# Patient Record
Sex: Male | Born: 1980 | Race: Black or African American | Hispanic: No | Marital: Single | Smoking: Current some day smoker
Health system: Southern US, Community
[De-identification: ages and names within clinical notes are randomized; demographics above are authoritative.]

## PROBLEM LIST (undated history)

## (undated) DIAGNOSIS — T7840XA Allergy, unspecified, initial encounter: Secondary | ICD-10-CM

## (undated) DIAGNOSIS — F819 Developmental disorder of scholastic skills, unspecified: Secondary | ICD-10-CM

## (undated) DIAGNOSIS — J45909 Unspecified asthma, uncomplicated: Secondary | ICD-10-CM

## (undated) HISTORY — DX: Developmental disorder of scholastic skills, unspecified: F81.9

## (undated) HISTORY — DX: Unspecified asthma, uncomplicated: J45.909

## (undated) HISTORY — DX: Allergy, unspecified, initial encounter: T78.40XA

---
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
## 2017-04-17 ENCOUNTER — Encounter: Payer: Self-pay | Admitting: Physician Assistant

## 2018-05-03 DIAGNOSIS — R6889 Other general symptoms and signs: Secondary | ICD-10-CM | POA: Diagnosis not present

## 2018-05-03 DIAGNOSIS — F329 Major depressive disorder, single episode, unspecified: Secondary | ICD-10-CM | POA: Diagnosis not present

## 2018-05-08 ENCOUNTER — Ambulatory Visit (INDEPENDENT_AMBULATORY_CARE_PROVIDER_SITE_OTHER): Payer: Medicare HMO | Admitting: Physician Assistant

## 2018-05-08 ENCOUNTER — Ambulatory Visit (INDEPENDENT_AMBULATORY_CARE_PROVIDER_SITE_OTHER): Payer: Medicare HMO

## 2018-05-08 ENCOUNTER — Encounter: Payer: Self-pay | Admitting: Physician Assistant

## 2018-05-08 ENCOUNTER — Other Ambulatory Visit: Payer: Self-pay

## 2018-05-08 VITALS — BP 128/79 | HR 85 | Temp 97.5°F | Resp 18 | Ht 71.65 in | Wt 214.6 lb

## 2018-05-08 DIAGNOSIS — J4530 Mild persistent asthma, uncomplicated: Secondary | ICD-10-CM

## 2018-05-08 DIAGNOSIS — J302 Other seasonal allergic rhinitis: Secondary | ICD-10-CM | POA: Diagnosis not present

## 2018-05-08 DIAGNOSIS — H5203 Hypermetropia, bilateral: Secondary | ICD-10-CM

## 2018-05-08 DIAGNOSIS — F329 Major depressive disorder, single episode, unspecified: Secondary | ICD-10-CM | POA: Diagnosis not present

## 2018-05-08 DIAGNOSIS — R6889 Other general symptoms and signs: Secondary | ICD-10-CM | POA: Diagnosis not present

## 2018-05-08 DIAGNOSIS — F819 Developmental disorder of scholastic skills, unspecified: Secondary | ICD-10-CM | POA: Diagnosis not present

## 2018-05-08 DIAGNOSIS — Z7689 Persons encountering health services in other specified circumstances: Secondary | ICD-10-CM

## 2018-05-08 DIAGNOSIS — F32A Depression, unspecified: Secondary | ICD-10-CM

## 2018-05-08 DIAGNOSIS — M25512 Pain in left shoulder: Secondary | ICD-10-CM

## 2018-05-08 DIAGNOSIS — Z23 Encounter for immunization: Secondary | ICD-10-CM

## 2018-05-08 DIAGNOSIS — M25511 Pain in right shoulder: Secondary | ICD-10-CM | POA: Diagnosis not present

## 2018-05-08 MED ORDER — ALBUTEROL SULFATE HFA 108 (90 BASE) MCG/ACT IN AERS: 2.0000 | INHALATION_SPRAY | RESPIRATORY_TRACT | 0 refills | Status: DC | PRN

## 2018-05-08 MED ORDER — FLOVENT HFA 110 MCG/ACT IN AERO: 2.0000 | INHALATION_SPRAY | Freq: Two times a day (BID) | RESPIRATORY_TRACT | 0 refills | Status: DC

## 2018-05-08 NOTE — Patient Instructions (Addendum)
For shoulder pain, we have obtain an x-ray today and will have those results back within the next few days.  In the meantime, I suggest using a heating pad to the affected area.  You may also use over-the-counter ibuprofen.  Avoid heavy lifting.  Depending on results, we may send referral to orthopedics.   For asthma, I suggest using Flovent twice daily as opposed to once daily to help control your allergies better.  Use albuterol only as needed.  Please follow-up in 6 weeks for reevaluation.  We can do a complete physical exam with lab work at that visit.  For eye issues, I have placed referral to ophthalmology.  They should contact you within the next 2 weeks.    If you have lab work done today you will be contacted with your lab results within the next 2 weeks.  If you have not heard from [place] then please contact us. The fastest way to get your results is to register for My Chart.  Shoulder Sprain A shoulder sprain is a partial or complete tear in one of the tough, fiber-like tissues (ligaments) in the shoulder. The ligaments in the shoulder help to hold the shoulder in place. What are the causes? This condition may be caused by:  A fall.  A hit to the shoulder.  A twist of the arm.  What increases the risk? This condition is more likely to develop in:  People who play sports.  People who have problems with balance or coordination.  What are the signs or symptoms? Symptoms of this condition include:  Pain when moving the shoulder.  Limited ability to move the shoulder.  Swelling and tenderness on top of the shoulder.  Warmth in the shoulder.  A change in the shape of the shoulder.  Redness or bruising on the shoulder.  How is this diagnosed? This condition is diagnosed with a physical exam. During the exam, you may be asked to do simple exercises with your shoulder. You may also have imaging tests, such as X-rays, MRI, or a CT scan. These tests can show how severe the  sprain is. How is this treated? This condition may be treated with:  Rest.  Pain medicine.  Ice.  A sling or brace. This is used to keep the arm still while the shoulder is healing.  Physical therapy or rehabilitation exercises. These help to improve the range of motion and strength of the shoulder.  Surgery (rare). Surgery may be needed if the sprain caused a joint to become unstable. Surgery may also be needed to reduce pain.  Some people may develop ongoing shoulder pain or lose some range of motion in the shoulder. However, most people do not develop long-term problems. Follow these instructions at home:  Rest.  Ask your health care provider when it is safe for you to drive if you have a sling or brace on your shoulder.  Take over-the-counter and prescription medicines only as told by your health care provider.  If directed, apply ice to the area: ? Put ice in a plastic bag. ? Place a towel between your skin and the bag. ? Leave the ice on for 20 minutes, 2-3 times per day.  If you were given a shoulder sling or brace: ? Wear it as told. ? Remove it to shower or bathe. ? Move your arm only as much as told by your health care provider, but keep your hand moving to prevent swelling.  If you were shown how  to do any exercises, do them as told by your health care provider.  Keep all follow-up visits as told by your health care provider. This is important. Contact a health care provider if:  Your pain gets worse.  Your pain is not relieved with medicines.  You have increased redness or swelling. Get help right away if:  You have a fever.  You cannot move your arm or shoulder.  You develop severe numbness or tingling in your arm, hand, or fingers.  Your arm, hand, or fingers turn blue, white, or gray and feel cold. This information is not intended to replace advice given to you by your health care provider. Make sure you discuss any questions you have with your  health care provider. Document Released: 11/26/2008 Document Revised: 03/05/2016 Document Reviewed: 11/02/2014 Elsevier Interactive Patient Education  2018 [employer].   IF you received an x-ray today, you will receive an invoice from [facility]. Please contact [facility] at [phone] with questions or concerns regarding your invoice.   IF you received labwork today, you will receive an invoice from [place]. Please contact [facility] at [phone] with questions or concerns regarding your invoice.   Our billing staff will not be able to assist you with questions regarding bills from these companies.  You will be contacted with the lab results as soon as they are available. The fastest way to get your results is to activate your My Chart account. Instructions are located on the last page of this paperwork. If you have not heard from [place] regarding the results in 2 weeks, please contact this office.

## 2018-05-08 NOTE — Progress Notes (Signed)
Frank Fox  MRN: [id number] DOB: 25-Feb-1981  Subjective:  Pt is a 37 y.o. male who presents to establish care, med refills, and shoulder pain. Last CPE 04/17/17.  Chronic conditions:  -Persistent asthma: Takes flovent inhaler daily and albuterol inhaler as needed. Feels well controlled on this medication. Has been having to use albuterol inhaler more lately due to being outside mowing grass this season. Sometimes get SOB, chest tightness, and wheezing. Needs med refills.   -Depression: Takes zoloft 100mg  daily. Seeing a therapist here. Sees them monthly. They provide him with refills of zoloft.   -Learning disability  -Seasonal allergies: Takes zyrtec and eye drops daily.   Acute issues:  -Left shoulder pain x 1 months. Happened after helping a friend move furniture. Feels it pop occasionally. Had someone pull it back in place a few days ago. Aggravated by lifting. Relieved with rest. Has not tried anything for pain. Denies numbness, tingling, redness, and warmth. PMH of shoulder dislocation ~15 years ago after football injury.    -Needs glasses and is looking for an eye doctor. Was seeing eye doctor in [place], dx with hypermetropia of b/l eyes but did not go back to them before moving.    There are no active problems to display for this patient.   Current Outpatient Medications on File Prior to Visit  Medication Sig Dispense Refill  . cetirizine (ZYRTEC) 10 MG tablet Take 10 mg by mouth daily.    Marland Kitchen ibuprofen (ADVIL,MOTRIN) 600 MG tablet Take 600 mg by mouth every 6 (six) hours as needed.    . sertraline (ZOLOFT) 100 MG tablet     . Olopatadine HCl 0.2 % SOLN      No current facility-administered medications on file prior to visit.     No Known Allergies  Social History   Socioeconomic History  . Marital status: Single    Spouse name: Not on file  . Number of children: 0  . Years of education: Not on file  . Highest education level: Not on file  Occupational History    . Occupation: unemployed  Social Needs  . Financial resource strain: Not on file  . Food insecurity:    Worry: Not on file    Inability: Not on file  . Transportation needs:    Medical: Not on file    Non-medical: Not on file  Tobacco Use  . Smoking status: Current Some Day Smoker    Types: Cigars  . Smokeless tobacco: Never Used  . Tobacco comment: occassionally smokes 1-2 black and milds a week  Substance and Sexual Activity  . Alcohol use: Yes    Alcohol/week: 2.0 standard drinks    Types: 2 Cans of beer per week  . Drug use: Not on file  . Sexual activity: Not Currently    Partners: Female  Lifestyle  . Physical activity:    Days per week: 0 days    Minutes per session: 0 min  . Stress: Not on file  Relationships  . Social connections:    Talks on phone: Not on file    Gets together: Not on file    Attends religious service: Not on file    Active member of club or organization: Not on file    Attends meetings of clubs or organizations: Not on file    Relationship status: Not on file  Other Topics Concern  . Not on file  Social History Narrative   Just moved from [place] to [place]. Living with brother.  Currently unemployed. He has a girlfriend. No children.     No family history on file.  Review of Systems  Constitutional: Negative for chills, diaphoresis, fatigue and fever.  Respiratory: Negative for cough and shortness of breath.   Cardiovascular: Negative for chest pain, palpitations and leg swelling.  Gastrointestinal: Negative for nausea and vomiting.  Neurological: Negative for dizziness and headaches.    Objective:  BP 128/79   Pulse 85   Temp (!) 97.5 F (36.4 C) (Oral)   Resp 18   Ht 5' 11.65" (1.82 m)   Wt 214 lb 9.6 oz (97.3 kg)   SpO2 95%   BMI 29.39 kg/m   Physical Exam  Constitutional: He is oriented to person, place, and time. He appears well-developed and well-nourished. No distress.  HENT:  Head: Normocephalic and atraumatic.   Eyes: Conjunctivae are normal.  Neck: Normal range of motion.  Cardiovascular: Normal rate, regular rhythm, normal heart sounds and intact distal pulses.  Pulses:      Radial pulses are 2+ on the right side, and 2+ on the left side.  Pulmonary/Chest: Effort normal and breath sounds normal. No stridor. No respiratory distress. He has no wheezes. He has no rhonchi. He has no rales. He exhibits no tenderness.  Musculoskeletal:       Left shoulder: He exhibits normal range of motion, no bony tenderness, no swelling, no effusion, normal pulse and normal strength.       Cervical back: He exhibits normal range of motion and no bony tenderness.       Back:  Neurological: He is alert and oriented to person, place, and time.  Reflex Scores:      Tricep reflexes are 2+ on the right side and 2+ on the left side.      Bicep reflexes are 2+ on the right side and 2+ on the left side. Sensation of BUE intact.   Skin: Skin is warm and dry.  Psychiatric: He has a normal mood and affect.  Vitals reviewed.   Visual Acuity Screening   Right eye Left eye Both eyes  Without correction: 20/20 20/20 20/20   With correction:      Dg Shoulder Left  Result Date: 05/08/2018 CLINICAL DATA:  Shoulder pain for 1 month EXAM: LEFT SHOULDER - 2+ VIEW COMPARISON:  None. FINDINGS: There is no evidence of fracture or dislocation. There is no evidence of arthropathy or other focal bone abnormality. Soft tissues are unremarkable. IMPRESSION: No acute osseous injury of the left shoulder. Electronically Signed   By: Elige Ko   On: 05/08/2018 15:33    ACT score of 17.   Assessment and Plan :  1. Left shoulder pain, unspecified chronicity No acute findings noted on plain films.  No acute neuro findings.  Suggest rest, heating pad, over-the-counter NSAIDs, and Rx muscle relaxant as needed for pain. F/u as needed. - DG Shoulder Left; Future - baclofen (LIORESAL) 10 MG tablet; Take 0.5-1 tablets (5-10 mg total) by mouth  3 (three) times daily.  Dispense: 30 each; Refill: 0  2. Hyperopia of both eyes Referral provided. - Ambulatory referral to Ophthalmology  3. Mild persistent asthma without complication ACT score of 17.  Suggesting not fully controlled.  Lungs CTA B.  Suggest increasing Flovent to twice daily as opposed to once daily.  Continue using albuterol as needed.  Avoid irritants.  Follow-up in 6 weeks for reevaluation.- FLOVENT HFA 110 MCG/ACT inhaler; Inhale 2 puffs into the lungs 2 (two) times daily.  Dispense: 3 Inhaler; Refill: 0 - albuterol (PROVENTIL HFA;VENTOLIN HFA) 108 (90 Base) MCG/ACT inhaler; Inhale 2 puffs into the lungs every 4 (four) hours as needed for wheezing or shortness of breath.  Dispense: 1 Inhaler; Refill: 0  4. Depression, unspecified depression type Continue Zoloft and following up with psychiatry as planned.   5. Learning disability 6. Seasonal allergies Refills provided. - cetirizine (ZYRTEC) 10 MG tablet; Take 10 mg by mouth daily. - Olopatadine HCl 0.2 % SOLN  7. Flu vaccine need - Flu Vaccine QUAD 36+ mos IM  8. Encounter to establish care I am happy to take over patient's care.  Recommend following up the next few weeks for complete physical exam and fasting labs.  A total of 45 minintes was spent in the room with the patient, greater than 50% of which was in counseling/coordination of care regarding chronic medical conditions and acute issues as above.  Benjiman Core, PA-C  Primary Care at [facility] 05/10/2018 12:26 PM

## 2018-05-09 DIAGNOSIS — F439 Reaction to severe stress, unspecified: Secondary | ICD-10-CM | POA: Diagnosis not present

## 2018-05-09 DIAGNOSIS — R6889 Other general symptoms and signs: Secondary | ICD-10-CM | POA: Diagnosis not present

## 2018-05-09 MED ORDER — BACLOFEN 10 MG PO TABS: 5.0000 mg | ORAL_TABLET | Freq: Three times a day (TID) | ORAL | 0 refills | Status: DC

## 2018-05-10 DIAGNOSIS — J302 Other seasonal allergic rhinitis: Secondary | ICD-10-CM | POA: Insufficient documentation

## 2018-05-10 DIAGNOSIS — H5203 Hypermetropia, bilateral: Secondary | ICD-10-CM | POA: Insufficient documentation

## 2018-05-10 DIAGNOSIS — F32A Depression, unspecified: Secondary | ICD-10-CM | POA: Insufficient documentation

## 2018-05-10 DIAGNOSIS — J4530 Mild persistent asthma, uncomplicated: Secondary | ICD-10-CM | POA: Insufficient documentation

## 2018-05-10 DIAGNOSIS — F329 Major depressive disorder, single episode, unspecified: Secondary | ICD-10-CM | POA: Insufficient documentation

## 2018-05-10 DIAGNOSIS — F819 Developmental disorder of scholastic skills, unspecified: Secondary | ICD-10-CM | POA: Insufficient documentation

## 2018-05-30 DIAGNOSIS — F329 Major depressive disorder, single episode, unspecified: Secondary | ICD-10-CM | POA: Diagnosis not present

## 2018-05-30 DIAGNOSIS — R6889 Other general symptoms and signs: Secondary | ICD-10-CM | POA: Diagnosis not present

## 2018-06-03 DIAGNOSIS — F329 Major depressive disorder, single episode, unspecified: Secondary | ICD-10-CM | POA: Diagnosis not present

## 2018-06-04 ENCOUNTER — Other Ambulatory Visit: Payer: Self-pay | Admitting: Physician Assistant

## 2018-06-04 DIAGNOSIS — M25512 Pain in left shoulder: Secondary | ICD-10-CM

## 2018-06-04 NOTE — Telephone Encounter (Signed)
Copied from CRM [id number]. Topic: Quick Communication - Rx Refill/Question >> Jun 04, 2018 10:30 AM Marylen PontoMcneil, Ja-Kwan wrote: Medication: baclofen (LIORESAL) 10 MG tablet  Has the patient contacted their pharmacy? no  Preferred Pharmacy (with phone number or street name): [id number] Ginette Otto- [place], [place] - [address] AT [facility] [address] [phone] (Phone) [phone] (Fax)  Agent: Please be advised that RX refills may take up to 3 business days. We ask that you follow-up with your pharmacy.

## 2018-06-04 NOTE — Telephone Encounter (Signed)
Requested medication (s) are due for refill today: yes  Requested medication (s) are on the active medication list: yes    Last refill: 05/09/18  #30  0 refills  Future visit scheduled   11/13/2018  Notes to clinic: not delegated  Requested Prescriptions  Pending Prescriptions Disp Refills   baclofen (LIORESAL) 10 MG tablet 30 each 0    Sig: Take 0.5-1 tablets (5-10 mg total) by mouth 3 (three) times daily.     Not Delegated - Analgesics:  Muscle Relaxants Failed - 06/04/2018 10:36 AM      Failed - This refill cannot be delegated      Passed - Valid encounter within last 6 months    Recent Outpatient Visits          3 weeks ago Left shoulder pain, unspecified chronicity   [facility], Gerald StabsBrittany D, PA-C      Future Appointments            In 5 months Doristine BosworthStallings, Zoe A, MD [facility] at [place], [facility]

## 2018-06-06 ENCOUNTER — Other Ambulatory Visit: Payer: Self-pay | Admitting: Physician Assistant

## 2018-06-06 DIAGNOSIS — J4530 Mild persistent asthma, uncomplicated: Secondary | ICD-10-CM

## 2018-06-06 DIAGNOSIS — M25512 Pain in left shoulder: Secondary | ICD-10-CM

## 2018-06-06 DIAGNOSIS — J302 Other seasonal allergic rhinitis: Secondary | ICD-10-CM

## 2018-06-06 DIAGNOSIS — F329 Major depressive disorder, single episode, unspecified: Secondary | ICD-10-CM | POA: Diagnosis not present

## 2018-06-06 MED ORDER — ALBUTEROL SULFATE HFA 108 (90 BASE) MCG/ACT IN AERS: 2.0000 | INHALATION_SPRAY | RESPIRATORY_TRACT | 2 refills | Status: AC | PRN

## 2018-06-06 MED ORDER — FLOVENT HFA 110 MCG/ACT IN AERO: 2.0000 | INHALATION_SPRAY | Freq: Two times a day (BID) | RESPIRATORY_TRACT | 1 refills | Status: AC

## 2018-06-06 NOTE — Telephone Encounter (Signed)
Requested medication (s) are due for refill today: yes  Requested medication (s) are on the active medication list: yes  Last refill:  Last refill of Baclofen 05/09/18 #30  Future visit scheduled: yes  Notes to clinic:  LOV on 05/08/18; Last refills of Zoloft and Olopatadine HCL 0.2% soln filled by historical provider    Requested Prescriptions  Pending Prescriptions Disp Refills   baclofen (LIORESAL) 10 MG tablet 30 each 0    Sig: Take 0.5-1 tablets (5-10 mg total) by mouth 3 (three) times daily.     Not Delegated - Analgesics:  Muscle Relaxants Failed - 06/06/2018  5:49 PM      Failed - This refill cannot be delegated      Passed - Valid encounter within last 6 months    Recent Outpatient Visits          4 weeks ago Left shoulder pain, unspecified chronicity   Primary Care at [facility], Gerald Stabs, PA-C      Future Appointments            In 5 months Doristine Bosworth, MD Primary Care at [place], [facility]          sertraline (ZOLOFT) 100 MG tablet       Psychiatry:  Antidepressants - SSRI Passed - 06/06/2018  5:49 PM      Passed - Completed PHQ-2 or PHQ-9 in the last 360 days.      Passed - Valid encounter within last 6 months    Recent Outpatient Visits          4 weeks ago Left shoulder pain, unspecified chronicity   Primary Care at [facility], Gerald Stabs, PA-C      Future Appointments            In 5 months Doristine Bosworth, MD Primary Care at [place], [facility]          Olopatadine HCl 0.2 % SOLN       Ophthalmology:  Antiallergy Passed - 06/06/2018  5:49 PM      Passed - Valid encounter within last 12 months    Recent Outpatient Visits          4 weeks ago Left shoulder pain, unspecified chronicity   Primary Care at [facility], Gerald Stabs, PA-C      Future Appointments            In 5 months Doristine Bosworth, MD Primary Care at [place], [facility]         Signed Prescriptions Disp Refills   FLOVENT HFA 110 MCG/ACT inhaler 3 Inhaler 1    Sig:  Inhale 2 puffs into the lungs 2 (two) times daily.     Pulmonology:  Corticosteroids Passed - 06/06/2018  5:49 PM      Passed - Valid encounter within last 12 months    Recent Outpatient Visits          4 weeks ago Left shoulder pain, unspecified chronicity   Primary Care at [facility], [place] D, PA-C      Future Appointments            In 5 months Doristine Bosworth, MD Primary Care at [place], [facility]          albuterol (PROVENTIL HFA;VENTOLIN HFA) 108 (90 Base) MCG/ACT inhaler 1 Inhaler 2    Sig: Inhale 2 puffs into the lungs every 4 (four) hours as needed for wheezing or shortness of breath.     Pulmonology:  Beta Agonists Failed - 06/06/2018  5:49 PM      Failed - One inhaler should last at least one month. If the patient is requesting refills earlier, contact the patient to check for uncontrolled symptoms.      Passed - Valid encounter within last 12 months    Recent Outpatient Visits          4 weeks ago Left shoulder pain, unspecified chronicity   [facility], Gerald StabsBrittany D, PA-C      Future Appointments            In 5 months Doristine BosworthStallings, Zoe A, MD [facility] at [place], [facility]

## 2018-06-06 NOTE — Telephone Encounter (Signed)
Copied from CRM [id number]. Topic: Quick Communication - Rx Refill/Question >> Jun 06, 2018  4:32 PM Maia PettiesOrtiz, Kristie S wrote: Medication: sertraline 100mg , Olopatadine HCl 0.2 % SOLN, FLOVENT HFA 110 MCG/ACT inhaler, baclofen (LIORESAL) 10 MG tablet, albuterol (PROVENTIL HFA;VENTOLIN HFA) 108 (90 Base) MCG/ACT inhaler  Pt has contacted mail order for ongoing refills for these 5 medications  Has the patient contacted their pharmacy? yes Preferred Pharmacy (with phone number or street name): [facility] [place], [place] - [id number] [phone] (Phone) [phone] (Fax)

## 2018-06-07 ENCOUNTER — Other Ambulatory Visit: Payer: Self-pay | Admitting: Physician Assistant

## 2018-06-07 DIAGNOSIS — M25512 Pain in left shoulder: Secondary | ICD-10-CM

## 2018-06-07 NOTE — Telephone Encounter (Signed)
Pt following up on refill request baclofen (LIORESAL) 10 MG tablet  Pt hopes to get a 90 day Rx, since GrenadaBrittany is leaving.  Pt states this med is really helping and he would like to stay on it. This was first requested 06/04/18.  [facility][id number] Ginette Otto- [place], [place] - [address] AT [facility] [address] [phone] (Phone) [phone] (Fax)

## 2018-06-11 MED ORDER — BACLOFEN 10 MG PO TABS: 5.0000 mg | ORAL_TABLET | Freq: Three times a day (TID) | ORAL | 0 refills | Status: DC

## 2018-06-11 NOTE — Telephone Encounter (Signed)
Spoke with pt regarding refill of baclofen. Advised that Barnett AbuWiseman needs pt to see another provider before refills will be filled. I was able to make pt an appt with Dr. Creta LevinStallings on 12/16 at 11:40. I advise dpt of time, building and late policy. Pt acknowledged.

## 2018-06-12 DIAGNOSIS — R6889 Other general symptoms and signs: Secondary | ICD-10-CM | POA: Diagnosis not present

## 2018-06-12 DIAGNOSIS — H5200 Hypermetropia, unspecified eye: Secondary | ICD-10-CM | POA: Diagnosis not present

## 2018-06-12 DIAGNOSIS — H40013 Open angle with borderline findings, low risk, bilateral: Secondary | ICD-10-CM | POA: Diagnosis not present

## 2018-06-19 ENCOUNTER — Telehealth: Payer: Self-pay | Admitting: Family Medicine

## 2018-06-19 NOTE — Telephone Encounter (Signed)
Called and spoke with pt regarding their appt on 07/08/18. Due to provider schedule changes, we needed to get pt rescheduled. I was able to reschedule with Dr. Creta LevinStallings on 07/10/18 at 10:40. I advised of time, building and late policy. Pt acknowledged.

## 2018-06-27 DIAGNOSIS — F329 Major depressive disorder, single episode, unspecified: Secondary | ICD-10-CM | POA: Diagnosis not present

## 2018-07-03 ENCOUNTER — Other Ambulatory Visit: Payer: Self-pay | Admitting: Physician Assistant

## 2018-07-03 DIAGNOSIS — J302 Other seasonal allergic rhinitis: Secondary | ICD-10-CM

## 2018-07-03 NOTE — Telephone Encounter (Signed)
Copied from CRM [id number]. Topic: Quick Communication - Rx Refill/Question >> Jul 03, 2018 11:30 AM [name], Frank E wrote: Medication: cetirizine (ZYRTEC) 10 MG tablet   Has the patient contacted their pharmacy? No. (out of medication)  Preferred Pharmacy (with phone number or street name): [facility] - [place], [place] - [id number] [phone] (Phone) [phone])    Agent: Please be advised that RX refills may take up to 3 business days. We ask that you follow-up with your pharmacy.

## 2018-07-04 DIAGNOSIS — F329 Major depressive disorder, single episode, unspecified: Secondary | ICD-10-CM | POA: Diagnosis not present

## 2018-07-04 DIAGNOSIS — R6889 Other general symptoms and signs: Secondary | ICD-10-CM | POA: Diagnosis not present

## 2018-07-04 NOTE — Telephone Encounter (Signed)
Requested medication (s) are due for refill today: yes  Requested medication (s) are on the active medication list: yes  Last refill:  Last filled by historical provider  Future visit scheduled: yes, 07/10/18 with Dr. Creta LevinStallings  Notes to clinic:  Unable to refill per protocol. Medication previously filled by historical provider    Requested Prescriptions  Pending Prescriptions Disp Refills   cetirizine (ZYRTEC) 10 MG tablet      Sig: Take 1 tablet (10 mg total) by mouth daily.     Ear, Nose, and Throat:  Antihistamines Passed - 07/03/2018  1:07 PM      Passed - Valid encounter within last 12 months    Recent Outpatient Visits          1 month ago Left shoulder pain, unspecified chronicity   [facility], Gerald StabsBrittany D, PA-C      Future Appointments            In 6 days Doristine BosworthStallings, Zoe A, MD [facility] [place], [facility]   In 4 months Doristine BosworthStallings, Zoe A, MD [facility] [place], [facility]

## 2018-07-06 MED ORDER — CETIRIZINE HCL 10 MG PO TABS: 10.0000 mg | ORAL_TABLET | Freq: Every day | ORAL | 0 refills | Status: DC

## 2018-07-08 ENCOUNTER — Ambulatory Visit: Payer: Medicare HMO | Admitting: Family Medicine

## 2018-07-09 DIAGNOSIS — R6889 Other general symptoms and signs: Secondary | ICD-10-CM | POA: Diagnosis not present

## 2018-07-10 ENCOUNTER — Ambulatory Visit (INDEPENDENT_AMBULATORY_CARE_PROVIDER_SITE_OTHER): Payer: Medicare HMO | Admitting: Family Medicine

## 2018-07-10 ENCOUNTER — Other Ambulatory Visit: Payer: Self-pay

## 2018-07-10 ENCOUNTER — Encounter: Payer: Self-pay | Admitting: Family Medicine

## 2018-07-10 VITALS — BP 129/87 | HR 84 | Temp 98.7°F | Resp 17 | Ht 71.65 in | Wt 214.2 lb

## 2018-07-10 DIAGNOSIS — J302 Other seasonal allergic rhinitis: Secondary | ICD-10-CM

## 2018-07-10 DIAGNOSIS — R6889 Other general symptoms and signs: Secondary | ICD-10-CM | POA: Diagnosis not present

## 2018-07-10 DIAGNOSIS — J4530 Mild persistent asthma, uncomplicated: Secondary | ICD-10-CM

## 2018-07-10 DIAGNOSIS — M62838 Other muscle spasm: Secondary | ICD-10-CM

## 2018-07-10 DIAGNOSIS — M25512 Pain in left shoulder: Secondary | ICD-10-CM

## 2018-07-10 DIAGNOSIS — L219 Seborrheic dermatitis, unspecified: Secondary | ICD-10-CM | POA: Diagnosis not present

## 2018-07-10 MED ORDER — CETIRIZINE HCL 10 MG PO TABS: 10.0000 mg | ORAL_TABLET | Freq: Every day | ORAL | 1 refills | Status: DC

## 2018-07-10 MED ORDER — BACLOFEN 10 MG PO TABS: 5.0000 mg | ORAL_TABLET | Freq: Three times a day (TID) | ORAL | 3 refills | Status: DC

## 2018-07-10 MED ORDER — KETOCONAZOLE 2 % EX SHAM: 1.0000 "application " | MEDICATED_SHAMPOO | CUTANEOUS | 2 refills | Status: DC

## 2018-07-10 MED ORDER — SERTRALINE HCL 100 MG PO TABS: 100.0000 mg | ORAL_TABLET | Freq: Every day | ORAL | 3 refills | Status: DC

## 2018-07-10 MED ORDER — TRIAMCINOLONE ACETONIDE 0.5 % EX OINT: 1.0000 "application " | TOPICAL_OINTMENT | Freq: Two times a day (BID) | CUTANEOUS | 5 refills | Status: DC

## 2018-07-10 NOTE — Patient Instructions (Addendum)
     If you have lab work done today you will be contacted with your lab results within the next 2 weeks.  If you have not heard from us then please contact us. The fastest way to get your results is to register for My Chart.   IF you received an x-ray today, you will receive an invoice from [facility]. Please contact [facility] at [phone] with questions or concerns regarding your invoice.   IF you received labwork today, you will receive an invoice from [facility]. Please contact [facility] at [phone] with questions or concerns regarding your invoice.   Our billing staff will not be able to assist you with questions regarding bills from these companies.  You will be contacted with the lab results as soon as they are available. The fastest way to get your results is to activate your My Chart account. Instructions are located on the last page of this paperwork. If you have not heard from us regarding the results in 2 weeks, please contact this office.    Seborrheic Dermatitis, Adult Seborrheic dermatitis is a skin disease that causes red, scaly patches. It usually occurs on the scalp, and it is often called dandruff. The patches may appear on other parts of the body. Skin patches tend to appear where there are many oil glands in the skin. Areas of the body that are commonly affected include:  Scalp.  Skin folds of the body.  Ears.  Eyebrows.  Neck.  Face.  Armpits.  The bearded area of men's faces. The condition may come and go for no known reason, and it is often long-lasting (chronic). What are the causes? The cause of this condition is not known.   What are the signs or symptoms? Symptoms of this condition include:  Thick scales on the scalp.  Redness on the face or in the armpits.  Skin that is flaky. The flakes may be white or yellow.  Skin that seems oily or dry but is not helped with moisturizers.  Itching or burning in the affected  areas. How is this diagnosed? This condition is diagnosed with a medical history and physical exam. A sample of your skin may be tested (skin biopsy). You may need to see a skin specialist (dermatologist). How is this treated? There is no cure for this condition, but treatment can help to manage the symptoms. You may get treatment to remove scales, lower the risk of skin infection, and reduce swelling or itching. Treatment may include:  Creams that reduce swelling and irritation (steroids).  Creams that reduce skin yeast.  Medicated shampoo, soaps, moisturizing creams, or ointments.  Medicated moisturizing creams or ointments. Follow these instructions at home:  Apply over-the-counter and prescription medicines only as told by your health care provider.  Use any medicated shampoo, soaps, skin creams, or ointments only as told by your health care provider.  Keep all follow-up visits as told by your health care provider. This is important. Contact a health care provider if:  Your symptoms do not improve with treatment.  Your symptoms get worse.  You have new symptoms. This information is not intended to replace advice given to you by your health care provider. Make sure you discuss any questions you have with your health care provider. Document Released: 07/10/2005 Document Revised: 01/28/2016 Document Reviewed: 10/28/2015 Elsevier Interactive Patient Education  2019 [employer]

## 2018-07-10 NOTE — Progress Notes (Signed)
Established Patient Office Visit  Subjective:  Patient ID: Frank Fox, male    DOB: 1981-04-20  Age: 37 y.o. MRN: [id number]  CC:  Chief Complaint  Patient presents with  . Medication Refill    baclofen and cetirizine and want eczema ointment for dry scalp    HPI [name] presents for   Asthma He takes albuterol and flovent His asthma is stable and although he gets some shortness of breath with yard work he still does well with his breathing. He has not had any asthma exacerbation in a long time  Allergy He reports that he has itcy scalp with eczema He uses flonase as well zyrtec Has a history of dry itchy scalp with dandrug  Muscle pain He reports that he takes he takes baclofen when he is in pain is bad from muscle cramps He also takes ibuprofen as well Denies dizziness Takes sparingly    Past Medical History:  Diagnosis Date  . Allergy   . Asthma   . Learning disability     Past Surgical History:  Procedure Laterality Date  . left knee sugery  2003    History reviewed. No pertinent family history.  Social History   Socioeconomic History  . Marital status: Single    Spouse name: Not on file  . Number of children: 0  . Years of education: Not on file  . Highest education level: Not on file  Occupational History  . Occupation: unemployed  Social Needs  . Financial resource strain: Not on file  . Food insecurity:    Worry: Not on file    Inability: Not on file  . Transportation needs:    Medical: Not on file    Non-medical: Not on file  Tobacco Use  . Smoking status: Current Some Day Smoker    Types: Cigars  . Smokeless tobacco: Never Used  . Tobacco comment: occassionally smokes 1-2 black and milds a week  Substance and Sexual Activity  . Alcohol use: Yes    Alcohol/week: 2.0 standard drinks    Types: 2 Cans of beer per week  . Drug use: Not on file  . Sexual activity: Not Currently    Partners: Female  Lifestyle  . Physical  activity:    Days per week: 0 days    Minutes per session: 0 min  . Stress: Not on file  Relationships  . Social connections:    Talks on phone: Not on file    Gets together: Not on file    Attends religious service: Not on file    Active member of club or organization: Not on file    Attends meetings of clubs or organizations: Not on file    Relationship status: Not on file  . Intimate partner violence:    Fear of current or ex partner: Not on file    Emotionally abused: Not on file    Physically abused: Not on file    Forced sexual activity: Not on file  Other Topics Concern  . Not on file  Social History Narrative   Just moved from [place] to [place]. Living with brother. Currently unemployed. He has a girlfriend. No children.     Outpatient Medications Prior to Visit  Medication Sig Dispense Refill  . albuterol (PROVENTIL HFA;VENTOLIN HFA) 108 (90 Base) MCG/ACT inhaler Inhale 2 puffs into the lungs every 4 (four) hours as needed for wheezing or shortness of breath. 1 Inhaler 2  . FLOVENT HFA 110 MCG/ACT inhaler Inhale  2 puffs into the lungs 2 (two) times daily. 3 Inhaler 1  . ibuprofen (ADVIL,MOTRIN) 600 MG tablet Take 600 mg by mouth every 6 (six) hours as needed.    . Olopatadine HCl 0.2 % SOLN     . baclofen (LIORESAL) 10 MG tablet Take 0.5-1 tablets (5-10 mg total) by mouth 3 (three) times daily. 30 each 0  . cetirizine (ZYRTEC) 10 MG tablet Take 1 tablet (10 mg total) by mouth daily. 90 tablet 0  . sertraline (ZOLOFT) 100 MG tablet      No facility-administered medications prior to visit.     No Known Allergies  ROS Review of Systems    Objective:    Physical Exam  BP 129/87 (BP Location: Right Arm, Patient Position: Sitting, Cuff Size: Large)   Pulse 84   Temp 98.7 F (37.1 C) (Oral)   Resp 17   Ht 5' 11.65" (1.82 m)   Wt 214 lb 3.2 oz (97.2 kg)   SpO2 97%   BMI 29.34 kg/m  Wt Readings from Last 3 Encounters:  07/10/18 214 lb 3.2 oz (97.2 kg)    05/08/18 214 lb 9.6 oz (97.3 kg)   Physical Exam  Constitutional: Oriented to person, place, and time. Appears well-developed and well-nourished.  HENT:  Head: Normocephalic and atraumatic.  Eyes: Conjunctivae and EOM are normal.  Cardiovascular: Normal rate, regular rhythm, normal heart sounds and intact distal pulses.  No murmur heard. Pulmonary/Chest: Effort normal and breath sounds normal. No stridor. No respiratory distress. Has no wheezes.  Neurological: Is alert and oriented to person, place, and time.  Skin: Skin is warm. Capillary refill takes less than 2 seconds.  Scalp with dry scaly flakes Psychiatric: Has a normal mood and affect. Behavior is normal. Judgment and thought content normal.    Health Maintenance Due  Topic Date Due  . HIV Screening  04/11/1998  . TETANUS/TDAP  04/11/2002    There are no preventive care reminders to display for this patient.  No results found for: TSH No results found for: WBC, HGB, HCT, MCV, PLT No results found for: NA, K, CHLORIDE, CO2, GLUCOSE, BUN, CREATININE, BILITOT, ALKPHOS, AST, ALT, PROT, ALBUMIN, CALCIUM, ANIONGAP, EGFR, GFR No results found for: CHOL No results found for: HDL No results found for: LDLCALC No results found for: TRIG No results found for: CHOLHDL No results found for: HGBA1C    Assessment & Plan:   Problem List Items Addressed This Visit      Respiratory   Mild persistent asthma without complication  -  Stable asthma  Continue current meds      Other   Seasonal allergies -   Advised continued zyrtec   Relevant Medications   cetirizine (ZYRTEC) 10 MG tablet    Other Visit Diagnoses    Seborrheic dermatitis of scalp    -  Primary  -   Advised shampoo twice weekly and topical cream   Muscle spasm       Left shoulder pain, unspecified chronicity       Relevant Medications   baclofen (LIORESAL) 10 MG tablet       Meds ordered this encounter  Medications  . cetirizine (ZYRTEC) 10 MG  tablet    Sig: Take 1 tablet (10 mg total) by mouth daily.    Dispense:  90 tablet    Refill:  1  . DISCONTD: ketoconazole (NIZORAL) 2 % shampoo    Sig: Apply 1 application topically 2 (two) times a week.      Dispense:  120 mL    Refill:  2  . triamcinolone ointment (KENALOG) 0.5 %    Sig: Apply 1 application topically 2 (two) times daily. Applied to the scalp.    Dispense:  30 g    Refill:  5  . baclofen (LIORESAL) 10 MG tablet    Sig: Take 0.5-1 tablets (5-10 mg total) by mouth 3 (three) times daily. Take as needed for pain    Dispense:  30 each    Refill:  3  . sertraline (ZOLOFT) 100 MG tablet    Sig: Take 1 tablet (100 mg total) by mouth at bedtime.    Dispense:  90 tablet    Refill:  3  . ketoconazole (NIZORAL) 2 % shampoo    Sig: Apply 1 application topically 2 (two) times a week.    Dispense:  120 mL    Refill:  2    Follow-up: Return in about 3 months (around 10/09/2018) for asthma follow up .    Zoe A Stallings, MD 

## 2018-07-11 DIAGNOSIS — R6889 Other general symptoms and signs: Secondary | ICD-10-CM | POA: Diagnosis not present

## 2018-07-12 DIAGNOSIS — R6889 Other general symptoms and signs: Secondary | ICD-10-CM | POA: Diagnosis not present

## 2018-08-06 DIAGNOSIS — F419 Anxiety disorder, unspecified: Secondary | ICD-10-CM | POA: Diagnosis not present

## 2018-08-06 DIAGNOSIS — R6889 Other general symptoms and signs: Secondary | ICD-10-CM | POA: Diagnosis not present

## 2018-08-09 DIAGNOSIS — R6889 Other general symptoms and signs: Secondary | ICD-10-CM | POA: Diagnosis not present

## 2018-08-20 ENCOUNTER — Telehealth: Payer: Self-pay | Admitting: Family Medicine

## 2018-08-20 DIAGNOSIS — F329 Major depressive disorder, single episode, unspecified: Secondary | ICD-10-CM | POA: Diagnosis not present

## 2018-08-20 DIAGNOSIS — R6889 Other general symptoms and signs: Secondary | ICD-10-CM | POA: Diagnosis not present

## 2018-08-20 NOTE — Telephone Encounter (Signed)
LVM for pt to call the office and reschedule their appt that was scheduled for 10/11/18 with Dr. Stallings. Due to a provider being out of the office, pt will need to reschedule. When pt calls back, please reschedule at their convenience. Thank you! °

## 2018-08-21 DIAGNOSIS — R6889 Other general symptoms and signs: Secondary | ICD-10-CM | POA: Diagnosis not present

## 2018-08-23 DIAGNOSIS — F329 Major depressive disorder, single episode, unspecified: Secondary | ICD-10-CM | POA: Diagnosis not present

## 2018-08-23 DIAGNOSIS — R6889 Other general symptoms and signs: Secondary | ICD-10-CM | POA: Diagnosis not present

## 2018-09-26 DIAGNOSIS — R6889 Other general symptoms and signs: Secondary | ICD-10-CM | POA: Diagnosis not present

## 2018-09-27 ENCOUNTER — Encounter

## 2018-09-27 DIAGNOSIS — R6889 Other general symptoms and signs: Secondary | ICD-10-CM | POA: Diagnosis not present

## 2018-10-03 DIAGNOSIS — R6889 Other general symptoms and signs: Secondary | ICD-10-CM | POA: Diagnosis not present

## 2018-10-04 DIAGNOSIS — R6889 Other general symptoms and signs: Secondary | ICD-10-CM | POA: Diagnosis not present

## 2018-10-04 DIAGNOSIS — F329 Major depressive disorder, single episode, unspecified: Secondary | ICD-10-CM | POA: Diagnosis not present

## 2018-10-07 ENCOUNTER — Other Ambulatory Visit: Payer: Self-pay | Admitting: Family Medicine

## 2018-10-07 DIAGNOSIS — J302 Other seasonal allergic rhinitis: Secondary | ICD-10-CM

## 2018-10-07 MED ORDER — CETIRIZINE HCL 10 MG PO TABS: 10.0000 mg | ORAL_TABLET | Freq: Every day | ORAL | 1 refills | Status: DC

## 2018-10-07 MED ORDER — KETOCONAZOLE 2 % EX SHAM: 1.0000 "application " | MEDICATED_SHAMPOO | CUTANEOUS | 2 refills | Status: DC

## 2018-10-07 NOTE — Telephone Encounter (Signed)
Copied from CRM [id number]. Topic: Quick Communication - Rx Refill/Question >> Oct 07, 2018  8:54 AM Retta Diones wrote: Medication: ketoconazole (NIZORAL) 2 % shampoo,  cetirizine (ZYRTEC) 10 MG tablet   Has the patient contacted their pharmacy? Yes.   (Agent: If no, request that the patient contact the pharmacy for the refill.) (Agent: If yes, when and what did the pharmacy advise?)  Preferred Pharmacy (with phone number or street name): [id number] [place], [place] - [address] AT [place] [facility] [address] [phone] (Phone) [phone] (Fax)    Agent: Please be advised that RX refills may take up to 3 business days. We ask that you follow-up with your pharmacy.

## 2018-10-10 DIAGNOSIS — F329 Major depressive disorder, single episode, unspecified: Secondary | ICD-10-CM | POA: Diagnosis not present

## 2018-10-11 ENCOUNTER — Ambulatory Visit: Payer: Medicare HMO | Admitting: Family Medicine

## 2018-10-17 DIAGNOSIS — F329 Major depressive disorder, single episode, unspecified: Secondary | ICD-10-CM | POA: Diagnosis not present

## 2018-10-21 ENCOUNTER — Other Ambulatory Visit (HOSPITAL_COMMUNITY)
Admission: RE | Admit: 2018-10-21 | Discharge: 2018-10-21 | Disposition: A | Discharge status: Home / Self Care | Payer: Medicare HMO | Source: Ambulatory Visit | Attending: Family Medicine | Admitting: Family Medicine

## 2018-10-21 ENCOUNTER — Ambulatory Visit (INDEPENDENT_AMBULATORY_CARE_PROVIDER_SITE_OTHER): Payer: Medicare HMO | Admitting: Family Medicine

## 2018-10-21 ENCOUNTER — Telehealth: Payer: Self-pay

## 2018-10-21 ENCOUNTER — Other Ambulatory Visit: Payer: Self-pay

## 2018-10-21 ENCOUNTER — Telehealth: Payer: Self-pay | Admitting: Family Medicine

## 2018-10-21 DIAGNOSIS — Z113 Encounter for screening for infections with a predominantly sexual mode of transmission: Secondary | ICD-10-CM | POA: Diagnosis not present

## 2018-10-21 DIAGNOSIS — Z0000 Encounter for general adult medical examination without abnormal findings: Secondary | ICD-10-CM

## 2018-10-21 DIAGNOSIS — R6889 Other general symptoms and signs: Secondary | ICD-10-CM | POA: Diagnosis not present

## 2018-10-21 NOTE — Telephone Encounter (Signed)
Tried contacting pt to offer telemedicine or webex visit with dr Creta Levin on4/09/2018 at 8 am if appt is still available upon pt c/b.  Otherwise give pt next available appt with stallings to discuss allergies/allergy meds. Dgaddy, CMA

## 2018-10-21 NOTE — Telephone Encounter (Signed)
Tried to call patient and emergency contact both phones are saying " the person you are trying to call is not accepting calls at this time"

## 2018-10-22 LAB — COMPREHENSIVE METABOLIC PANEL
ALBUMIN: 4.4 g/dL (ref 4.0–5.0)
ALT: 15 IU/L (ref 0–44)
AST: 15 IU/L (ref 0–40)
Albumin/Globulin Ratio: 1.3 (ref 1.2–2.2)
Alkaline Phosphatase: 46 IU/L (ref 39–117)
BUN / CREAT RATIO: 9 (ref 9–20)
BUN: 8 mg/dL (ref 6–20)
Bilirubin Total: 0.3 mg/dL (ref 0.0–1.2)
CO2: 22 mmol/L (ref 20–29)
Calcium: 10 mg/dL (ref 8.7–10.2)
Chloride: 101 mmol/L (ref 96–106)
Creatinine, Ser: 0.88 mg/dL (ref 0.76–1.27)
GFR calc Af Amer: 129 mL/min/{1.73_m2} (ref >59)
GFR calc non Af Amer: 111 mL/min/{1.73_m2} (ref >59)
Globulin, Total: 3.4 g/dL (ref 1.5–4.5)
Glucose: 138 mg/dL — ABNORMAL HIGH (ref 65–99)
Potassium: 3.9 mmol/L (ref 3.5–5.2)
Sodium: 141 mmol/L (ref 134–144)
Total Protein: 7.8 g/dL (ref 6.0–8.5)

## 2018-10-22 LAB — LIPID PANEL
Chol/HDL Ratio: 3.2 ratio (ref 0.0–5.0)
Cholesterol, Total: 156 mg/dL (ref 100–199)
HDL: 49 mg/dL (ref >39)
LDL Calculated: 88 mg/dL (ref 0–99)
Triglycerides: 93 mg/dL (ref 0–149)
VLDL Cholesterol Cal: 19 mg/dL (ref 5–40)

## 2018-10-22 LAB — HEPATITIS B SURFACE ANTIGEN: Hepatitis B Surface Ag: NEGATIVE

## 2018-10-22 LAB — GC/CHLAMYDIA PROBE AMP (~~LOC~~) NOT AT ARMC
CHLAMYDIA, DNA PROBE: NEGATIVE
Neisseria Gonorrhea: NEGATIVE

## 2018-10-22 LAB — HIV ANTIBODY (ROUTINE TESTING W REFLEX): HIV SCREEN 4TH GENERATION: NONREACTIVE

## 2018-10-22 LAB — RPR: RPR Ser Ql: NONREACTIVE

## 2018-10-25 ENCOUNTER — Other Ambulatory Visit: Payer: Self-pay

## 2018-10-25 ENCOUNTER — Ambulatory Visit: Payer: Medicare HMO | Admitting: Family Medicine

## 2018-10-25 NOTE — Patient Instructions (Signed)
° ° ° °  If you have lab work done today you will be contacted with your lab results within the next 2 weeks.  If you have not heard from us then please contact us. The fastest way to get your results is to register for My Chart. ° ° °IF you received an x-ray today, you will receive an invoice from [facility]. Please contact [facility] at [phone] with questions or concerns regarding your invoice.  ° °IF you received labwork today, you will receive an invoice from [facility]. Please contact [facility] at [phone] with questions or concerns regarding your invoice.  ° °Our billing staff will not be able to assist you with questions regarding bills from these companies. ° °You will be contacted with the lab results as soon as they are available. The fastest way to get your results is to activate your My Chart account. Instructions are located on the last page of this paperwork. If you have not heard from us regarding the results in 2 weeks, please contact this office. °  ° ° ° °

## 2018-10-25 NOTE — Progress Notes (Signed)
No show

## 2018-10-27 NOTE — Progress Notes (Deleted)
No show

## 2018-10-28 ENCOUNTER — Telehealth (INDEPENDENT_AMBULATORY_CARE_PROVIDER_SITE_OTHER): Payer: Medicare HMO | Admitting: Family Medicine

## 2018-10-28 DIAGNOSIS — M25562 Pain in left knee: Secondary | ICD-10-CM | POA: Diagnosis not present

## 2018-10-28 DIAGNOSIS — G4730 Sleep apnea, unspecified: Secondary | ICD-10-CM

## 2018-10-28 DIAGNOSIS — G8929 Other chronic pain: Secondary | ICD-10-CM

## 2018-10-28 DIAGNOSIS — J302 Other seasonal allergic rhinitis: Secondary | ICD-10-CM

## 2018-10-28 DIAGNOSIS — M25512 Pain in left shoulder: Secondary | ICD-10-CM

## 2018-10-28 DIAGNOSIS — R0683 Snoring: Secondary | ICD-10-CM

## 2018-10-28 DIAGNOSIS — G479 Sleep disorder, unspecified: Secondary | ICD-10-CM | POA: Diagnosis not present

## 2018-10-28 MED ORDER — OLOPATADINE HCL 0.2 % OP SOLN: 1.0000 [drp] | Freq: Every day | OPHTHALMIC | 4 refills | Status: DC

## 2018-10-28 MED ORDER — FLUTICASONE PROPIONATE 50 MCG/ACT NA SUSP: 2.0000 | Freq: Every day | NASAL | 6 refills | Status: DC

## 2018-10-28 NOTE — Progress Notes (Signed)
CC:  Discuss sleep study.  Pt requesting refill on olopatadine 0.2% solution.  Pt wanting to discuss left knee and ? Knee brace or cortisone shots because his knee pops and causing his leg to feel as if its going to buckle underneath him.  No travel outside the [place] or [place] in the past 3 weeks.

## 2018-10-28 NOTE — Progress Notes (Signed)
Telemedicine Encounter- SOAP NOTE Established Patient  This telephone encounter was conducted with the patient's (or proxy's) verbal consent via audio telecommunications: yes/no: Yes Patient was instructed to have this encounter in a suitably private space; and to only have persons present to whom they give permission to participate. In addition, patient identity was confirmed by use of name plus two identifiers (DOB and address).  I discussed the limitations, risks, security and privacy concerns of performing an evaluation and management service by telephone and the availability of in person appointments. I also discussed with the patient that there may be a patient responsible charge related to this service. The patient expressed understanding and agreed to proceed.  I spent a total of TIME; 0 MIN TO 60 MIN: 25 minutes talking with the patient or their proxy.   No chief complaint on file.   Subjective   Frank Fox is a 38 y.o. established patient. Telephone visit today for  HPI  Sleep disorder Epworth sleep score 20 Snores and gasps Has daytime sleepiness Wakes up multiple times in the night He drinks a lot of water during the day and wakes up to urinate  Left knee pain He exercise  physical therapy for the knee which is helping He continues to have chronic pain from the left knee He denies edema  He denies recent weight gain  He also reports left shoulder pain and would like the shoulder added on to his PT He denies loss of function of the arm, numbness or tingling, or any pain in the elbow He denies injury.  Chronic allergic rhinitis Patient reports congestion at times with sneezing and runny watery eyes He would like his pataday eye drops refilled No fevers or chills He has seasonal allergies and year round allergies   Patient Active Problem List   Diagnosis Date Noted  . Excessive daytime sleepiness 11/12/2018  . Poor sleep hygiene 11/12/2018  . Abnormal  circadian rhythm 11/12/2018  . Snoring 11/12/2018  . Sleep apnea 11/12/2018  . Mild persistent asthma without complication 05/10/2018  . Depression 05/10/2018  . Learning disability 05/10/2018  . Seasonal allergies 05/10/2018  . Hyperopia of both eyes 05/10/2018    Past Medical History:  Diagnosis Date  . Allergy   . Asthma   . Learning disability     Current Outpatient Medications  Medication Sig Dispense Refill  . albuterol (PROVENTIL HFA;VENTOLIN HFA) 108 (90 Base) MCG/ACT inhaler Inhale 2 puffs into the lungs every 4 (four) hours as needed for wheezing or shortness of breath. 1 Inhaler 2  . baclofen (LIORESAL) 10 MG tablet Take 0.5-1 tablets (5-10 mg total) by mouth 3 (three) times daily. Take as needed for pain 30 each 3  . cetirizine (ZYRTEC) 10 MG tablet Take 1 tablet (10 mg total) by mouth daily. 90 tablet 1  . FLOVENT HFA 110 MCG/ACT inhaler Inhale 2 puffs into the lungs 2 (two) times daily. 3 Inhaler 1  . ibuprofen (ADVIL,MOTRIN) 600 MG tablet Take 600 mg by mouth every 6 (six) hours as needed.    Marland Kitchen ketoconazole (NIZORAL) 2 % shampoo Apply 1 application topically 2 (two) times a week. 120 mL 2  . sertraline (ZOLOFT) 100 MG tablet Take 1 tablet (100 mg total) by mouth at bedtime. 90 tablet 3  . triamcinolone ointment (KENALOG) 0.5 % Apply 1 application topically 2 (two) times daily. Applied to the scalp. 30 g 5  . fluticasone (FLONASE) 50 MCG/ACT nasal spray Place 2 sprays into both nostrils  daily. 16 g 6  . Olopatadine HCl 0.2 % SOLN Apply 1 drop to eye daily. 1 Bottle 4   No current facility-administered medications for this visit.     No Known Allergies  Social History   Socioeconomic History  . Marital status: Single    Spouse name: Not on file  . Number of children: 0  . Years of education: Not on file  . Highest education level: Not on file  Occupational History  . Occupation: unemployed  Social Needs  . Financial resource strain: Not on file  . Food  insecurity:    Worry: Not on file    Inability: Not on file  . Transportation needs:    Medical: Not on file    Non-medical: Not on file  Tobacco Use  . Smoking status: Current Some Day Smoker    Types: Cigars  . Smokeless tobacco: Never Used  . Tobacco comment: occassionally smokes 1-2 black and milds a week  Substance and Sexual Activity  . Alcohol use: Yes    Comment: occasional  . Drug use: Not Currently  . Sexual activity: Not Currently    Partners: Female  Lifestyle  . Physical activity:    Days per week: 0 days    Minutes per session: 0 min  . Stress: Not on file  Relationships  . Social connections:    Talks on phone: Not on file    Gets together: Not on file    Attends religious service: Not on file    Active member of club or organization: Not on file    Attends meetings of clubs or organizations: Not on file    Relationship status: Not on file  . Intimate partner violence:    Fear of current or ex partner: Not on file    Emotionally abused: Not on file    Physically abused: Not on file    Forced sexual activity: Not on file  Other Topics Concern  . Not on file  Social History Narrative   Just moved from [place] to [place]. Living with brother. Currently unemployed. He has a girlfriend. No children.     ROS Review of Systems  Constitutional: Negative for activity change, appetite change, chills and fever.  HENT: Negative for congestion, nosebleeds, trouble swallowing and voice change.   Respiratory: Negative for cough, shortness of breath and wheezing.   Gastrointestinal: Negative for diarrhea, nausea and vomiting.  Genitourinary: Negative for difficulty urinating, dysuria, flank pain and hematuria.  Musculoskeletal: SEE HPI Neurological: Negative for dizziness, speech difficulty, light-headedness and numbness.  See HPI. All other review of systems negative.   Objective   Vitals as reported by the patient: There were no vitals filed for this visit.   Diagnoses and all orders for this visit:  Sleep disorder -     Ambulatory referral to Sleep Studies  Chronic pain of left knee- continue with PT -     Ambulatory referral to Physical Therapy  Snoring -     Ambulatory referral to Sleep Studies  Observed sleep apnea- pt would benefit from sleep study -     Ambulatory referral to Sleep Studies  Chronic left shoulder pain- add on to PT -     Ambulatory referral to Physical Therapy  Seasonal allergies- continue flonase, zyrtec and olopatadine -     Discontinue: Olopatadine HCl 0.2 % SOLN; Apply 1 drop to eye daily. -     Olopatadine HCl 0.2 % SOLN; Apply 1 drop to eye daily.  Other orders -     fluticasone (FLONASE) 50 MCG/ACT nasal spray; Place 2 sprays into both nostrils daily.     I discussed the assessment and treatment plan with the patient. The patient was provided an opportunity to ask questions and all were answered. The patient agreed with the plan and demonstrated an understanding of the instructions.   The patient was advised to call back or seek an in-person evaluation if the symptoms worsen or if the condition fails to improve as anticipated.  I provided 25 minutes of non-face-to-face time during this encounter.  Tieler Cournoyer A Pete Schnitzer, MD  [facility]

## 2018-10-28 NOTE — Patient Instructions (Signed)
     If you have lab work done today you will be contacted with your lab results within the next 2 weeks.  If you have not heard from us then please contact us. The fastest way to get your results is to register for My Chart.   IF you received an x-ray today, you will receive an invoice from [facility]. Please contact  Radiology at [phone] with questions or concerns regarding your invoice.   IF you received labwork today, you will receive an invoice from [facility]. Please contact [facility] at [phone] with questions or concerns regarding your invoice.   Our billing staff will not be able to assist you with questions regarding bills from these companies.  You will be contacted with the lab results as soon as they are available. The fastest way to get your results is to activate your My Chart account. Instructions are located on the last page of this paperwork. If you have not heard from us regarding the results in 2 weeks, please contact this office.    CC 

## 2018-10-31 DIAGNOSIS — F329 Major depressive disorder, single episode, unspecified: Secondary | ICD-10-CM | POA: Diagnosis not present

## 2018-11-06 ENCOUNTER — Encounter: Payer: Self-pay | Admitting: Neurology

## 2018-11-06 ENCOUNTER — Telehealth: Payer: Self-pay | Admitting: Neurology

## 2018-11-06 NOTE — Telephone Encounter (Signed)
Due to current COVID 19 pandemic, our office is severely reducing in office visits, in order to minimize the risk to our patients and healthcare providers.  Pt understands that although there may be some limitations with this type of visit, we will take all precautions to reduce any security or privacy concerns.  Pt understands that this will be treated like an in office visit and we will file with pt's insurance, and there may be a patient responsible charge related to this service. Pt's email is [email or web address]. Pt understands that the [employer] webex software must be downloaded and operational on the device pt plans to use for the visit. Pt understands that the nurse will be calling to go over pt's chart.

## 2018-11-06 NOTE — Telephone Encounter (Signed)
Called the patient to review his chart with him. There was no answer. LVM informing the patient to call back to review

## 2018-11-06 NOTE — Telephone Encounter (Signed)
Patient called back and we discussed his chart and made sure everything was updated. Advised the pt to complete the sleep scale questions and reply those back to me. Patient states he has the app downloaded and also has received the email.

## 2018-11-12 ENCOUNTER — Encounter: Payer: Self-pay | Admitting: Neurology

## 2018-11-12 ENCOUNTER — Telehealth: Payer: Self-pay | Admitting: Neurology

## 2018-11-12 ENCOUNTER — Ambulatory Visit (INDEPENDENT_AMBULATORY_CARE_PROVIDER_SITE_OTHER): Payer: Medicare HMO | Admitting: Neurology

## 2018-11-12 ENCOUNTER — Other Ambulatory Visit: Payer: Self-pay

## 2018-11-12 DIAGNOSIS — G4730 Sleep apnea, unspecified: Secondary | ICD-10-CM | POA: Diagnosis not present

## 2018-11-12 DIAGNOSIS — G4720 Circadian rhythm sleep disorder, unspecified type: Secondary | ICD-10-CM | POA: Diagnosis not present

## 2018-11-12 DIAGNOSIS — G4719 Other hypersomnia: Secondary | ICD-10-CM | POA: Insufficient documentation

## 2018-11-12 DIAGNOSIS — Z72821 Inadequate sleep hygiene: Secondary | ICD-10-CM

## 2018-11-12 DIAGNOSIS — R0683 Snoring: Secondary | ICD-10-CM | POA: Diagnosis not present

## 2018-11-12 DIAGNOSIS — F819 Developmental disorder of scholastic skills, unspecified: Secondary | ICD-10-CM | POA: Diagnosis not present

## 2018-11-12 DIAGNOSIS — J4530 Mild persistent asthma, uncomplicated: Secondary | ICD-10-CM | POA: Diagnosis not present

## 2018-11-12 DIAGNOSIS — F341 Dysthymic disorder: Secondary | ICD-10-CM

## 2018-11-12 NOTE — Telephone Encounter (Signed)
Pt called in and stated that he would lke his sleep study supplies maile dout to   [address] [place] [address]

## 2018-11-12 NOTE — Progress Notes (Signed)
Virtual Visit via Video Note  I connected with Frank Fox on 11/12/18 at 11:00 AM EDT by a video enabled telemedicine application and verified that I am speaking with the correct person using two identifiers.   I discussed the limitations of evaluation and management by telemedicine and the availability of in person appointments. The patient expressed understanding and agreed to proceed.    SLEEP MEDICINE CLINIC   Provider:  Melvyn Novas, MD   Primary Care Physician:  Doristine Bosworth, MD   Referring Provider: same  EDS  HPI:  Frank Fox is a 38 y.o. male patient, a single afro- americinman living with his family, parents and younger brother. He has a historyof learning disability. , seen here as in a referral  from Dr. Creta Levin for evaluation of EDS ( excessive daytime sleepiness) .His family witnesses apnea and snoring each night. His mother has OSA.  He reports being very daytime sleepy, but also lacking routines of framing his day and night rhythm   Sleep and medical history : Mr. Reth has a active diagnosis list of mild persistent asthma, depression, history of delayed development and learning disabilities, seasonal allergies with hayfever-rhinitis.  He reportedly dislocated his left shoulder which spontaneously has been back in place but she still has a prescription for alleles it resolved-baclofen to help him with pain and muscle spasms.  He is also treated with an antidepressant sertraline.    Family sleep history: Family history is positive for mother with obstructive sleep apnea and diabetes, biological father with diabetes and hypertension, he has a younger brother age 59 who is healthy he himself is 45 at this time.  He has never been able to drive.   Social history: The patient moved from [place] to [place] to live with his brother is currently unemployed and had before worked in Phot[employer].  He has a girlfriend, no children she is an active smoker but uses  cigars not cigarettes, he may drink 2 or 3 beers on a weekend night, 1 or 2 coffees each morning and sometimes when eating out he may drink ice tea.   Sleep habits are as follows: Dinnertime is usually around 7:30 PM and before the coronavirus bandemia has changed all of our lives he was usually in the gym 3 evenings a week.  His bedtime has now advanced as he is not going out to 9:30 PM and he is usually asleep by 10 PM.  But he initially describes his bedroom as cool, quiet and dark he is soon told me that he actually sleeps with the TV on he sleeps supine on 2 pillows but he wakes up gasping as if choking for air.  Often he wakes with a dry mouth.  He also goes to the bathroom once or twice at night but this has been not new and he does drink a lot of water during the day he reports.  He is usually quickly back to sleep.  But he also wakes up hot sweaty clammy.  He says that he dreams most all almost every night and that he rises after 6 or 8 hours of sleep around noontime which means that while he sleeps only half of the time in bed he spends the other half of the bedtime watching TV.  Since he has moved in with his brother he states that his sleep hygiene has deteriorated also because he has no external employment.   He is aware that he snores and his brother has witnessed  his apneas as well.   Review of Systems: Out of a complete 14 system review, the patient complains of only the following symptoms, and all other reviewed systems are negative. How likely are you to doze in the following situations: 0 = not likely, 1 = slight chance, 2 = moderate chance, 3 = high chance  Sitting and Reading? Watching Television? Sitting inactive in a public place (theater or meeting)? Lying down in the afternoon when circumstances permit? Sitting and talking to someone? Sitting quietly after lunch without alcohol? As a passenger in a car for an hour without a break?  Total = the patient endorsed 20 out of  21 points given that the question of driving does not apply to him.     Social History   Socioeconomic History  . Marital status: Single    Spouse name: Not on file  . Number of children: 0  . Years of education: Not on file  . Highest education level: Not on file  Occupational History  . Occupation: unemployed  Social Needs  . Financial resource strain: Not on file  . Food insecurity:    Worry: Not on file    Inability: Not on file  . Transportation needs:    Medical: Not on file    Non-medical: Not on file  Tobacco Use  . Smoking status: Current Some Day Smoker    Types: Cigars  . Smokeless tobacco: Never Used  . Tobacco comment: occassionally smokes 1-2 black and milds a week  Substance and Sexual Activity  . Alcohol use: Yes    Comment: occasional  . Drug use: Not Currently  . Sexual activity: Not Currently    Partners: Female  Lifestyle  . Physical activity:    Days per week: 0 days    Minutes per session: 0 min  . Stress: Not on file  Relationships  . Social connections:    Talks on phone: Not on file    Gets together: Not on file    Attends religious service: Not on file    Active member of club or organization: Not on file    Attends meetings of clubs or organizations: Not on file    Relationship status: Not on file  . Intimate partner violence:    Fear of current or ex partner: Not on file    Emotionally abused: Not on file    Physically abused: Not on file    Forced sexual activity: Not on file  Other Topics Concern  . Not on file  Social History Narrative   Just moved from [place] to [place]. Living with brother. Currently unemployed. He has a girlfriend. No children.     Family History  Problem Relation Age of Onset  . Diabetes Father     Past Medical History:  Diagnosis Date  . Allergy   . Asthma   . Learning disability     Past Surgical History:  Procedure Laterality Date  . left knee sugery  2003    Current Outpatient  Medications  Medication Sig Dispense Refill  . albuterol (PROVENTIL HFA;VENTOLIN HFA) 108 (90 Base) MCG/ACT inhaler Inhale 2 puffs into the lungs every 4 (four) hours as needed for wheezing or shortness of breath. 1 Inhaler 2  . baclofen (LIORESAL) 10 MG tablet Take 0.5-1 tablets (5-10 mg total) by mouth 3 (three) times daily. Take as needed for pain 30 each 3  . cetirizine (ZYRTEC) 10 MG tablet Take 1 tablet (10 mg total) by mouth daily.  90 tablet 1  . FLOVENT HFA 110 MCG/ACT inhaler Inhale 2 puffs into the lungs 2 (two) times daily. 3 Inhaler 1  . fluticasone (FLONASE) 50 MCG/ACT nasal spray Place 2 sprays into both nostrils daily. 16 g 6  . ibuprofen (ADVIL,MOTRIN) 600 MG tablet Take 600 mg by mouth every 6 (six) hours as needed.    Marland Kitchen ketoconazole (NIZORAL) 2 % shampoo Apply 1 application topically 2 (two) times a week. 120 mL 2  . Olopatadine HCl 0.2 % SOLN Apply 1 drop to eye daily. 1 Bottle 4  . sertraline (ZOLOFT) 100 MG tablet Take 1 tablet (100 mg total) by mouth at bedtime. 90 tablet 3  . triamcinolone ointment (KENALOG) 0.5 % Apply 1 application topically 2 (two) times daily. Applied to the scalp. 30 g 5   No current facility-administered medications for this visit.     Allergies as of 11/12/2018  . (No Known Allergies)    Vitals: There were no vitals taken for this visit. Last Weight:  Wt Readings from Last 1 Encounters:  07/10/18 214 lb 3.2 oz (97.2 kg)   NGE:XBMWU is no height or weight on file to calculate BMI.     Last Height:   Ht Readings from Last 1 Encounters:  07/10/18 5' 11.65" (1.82 m)   Observation:  General: The patient is awake, alert and appears not in acute distress. The patient is well groomed. Head: Normocephalic, atraumatic.  Neck is supple. Mallampati 4,  neck circumference:17". Nasal airflow patent ,  Retrognathia is seen.   Neurologic exam : The patient is awake and alert, oriented to place and time.    Attention span & concentration ability  appears normal.  Speech is fluent,  Without dysarthria, dysphonia or aphasia.  Mood and affect are appropriate.  Cranial nerves: Pupils are equal and briskly reactive to light.. Extraocular movements intact and without nystagmus.  Facial motor strength is symmetric and tongue and uvula move midline.  Shoulder shrug was symmetrical.   Motor exam:   Normal ROM, muscle bulk and symmetric strength in upper extremities. Coordination: Rapid alternating movements in the fingers/hands was normal. Finger-to-nose maneuver  normal without evidence of ataxia, dysmetria or tremor.  Gait and station: Patient walks without assistive device, he was able to demonstrate normal posture and mobility in front of the camera.    Assessment and Plan:  Circadian rhythm deterioration, severe EDS  With Epworth sore at 20/ 24  Points.  Witnessed apnea andsnoring by family members.  The problem of recurrent insomnia is discussed.   Avoidance of caffeine after noon time is strongly encouraged. Sleep hygiene issues are reviewed.   Circadian rhythm has been deteriorating since staying at home due to Coronavirus 19. We discussed establishing a bedtime, eliminating TV from the bedroom at night, sleep time allocation for 8 hours= If you go to bed at midnight, rise at 8 AM every morning.   HST ordered, patient is not able to drive, likes device by mail.   Follow Up Instructions: Rv in 2-3 month with Np.     I discussed the assessment and treatment plan with the patient. The patient was provided an opportunity to ask questions and all were answered. The patient agreed with the plan and demonstrated an understanding of the instructions.   The patient was advised to call back or seek an in-person evaluation if the symptoms worsen or if the condition fails to improve as anticipated.  I provided 28 minutes of non-face-to-face time during this encounter.  Melvyn NovasARMEN Saim Almanza, MD 11/12/2018, 11:24 AM  Certified in Neurology  by ABPN Certified in Sleep Medicine by [facility]  [facility] [address], [address] [place], [place] [id number]

## 2018-11-12 NOTE — Telephone Encounter (Signed)
Noted  

## 2018-11-12 NOTE — Patient Instructions (Signed)
Screening for Sleep Apnea  Sleep apnea is a condition in which breathing pauses or becomes shallow during sleep. Sleep apnea screening is a test to determine if you are at risk for sleep apnea. The test is easy and only takes a few minutes. Your health care provider may ask you to have this test in preparation for surgery or as part of a physical exam. What are the symptoms of sleep apnea? Common symptoms of sleep apnea include:  Snoring.  Restless sleep.  Daytime sleepiness.  Pauses in breathing.  Choking during sleep.  Irritability.  Forgetfulness.  Trouble thinking clearly.  Depression.  Personality changes. Most people with sleep apnea are not aware that they have it. Why should I get screened? Getting screened for sleep apnea can help:  Ensure your safety. It is important for your health care providers to know whether or not you have sleep apnea, especially if you are having surgery or have other long-term (chronic) health conditions.  Improve your health and allow you to get a better night's rest. Restful sleep can help you: ? Have more energy. ? Lose weight. ? Improve high blood pressure. ? Improve diabetes management. ? Prevent stroke. ? Prevent car accidents. How is screening done? Screening usually includes being asked a list of questions about your sleep quality. Some questions you may be asked include:  Do you snore?  Is your sleep restless?  Do you have daytime sleepiness?  Has a partner or spouse told you that you stop breathing during sleep?  Have you had trouble concentrating or memory loss? If your screening test is positive, you are at risk for the condition. Further testing may be needed to confirm a diagnosis of sleep apnea. Where to find more information You can find screening tools online or at your health care clinic. For more information about sleep apnea screening and healthy sleep, visit these websites:  Centers for Disease  Control and Prevention: DetailSports.is  American Sleep Apnea Association: www.sleepapnea.org Contact a health care provider if:  You think that you may have sleep apnea. Summary  Sleep apnea screening can help determine if you are at risk for sleep apnea.  It is important for your health care providers to know whether or not you have sleep apnea, especially if you are having surgery or have other chronic health conditions.  You may be asked to take a screening test for sleep apnea in preparation for surgery or as part of a physical exam. This information is not intended to replace advice given to you by your health care provider. Make sure you discuss any questions you have with your health care provider. Document Released: 10/20/2016 Document Revised: 10/20/2016 Document Reviewed: 10/20/2016 Elsevier Interactive Patient Education  2019 [employer]. Please remember to try to maintain good sleep hygiene, which means: Keep a regular sleep and wake schedule, try not to exercise or have a meal within 2 hours of your bedtime, try to keep your bedroom conducive for sleep, that is, cool and dark, without light distractors such as an illuminated alarm clock, and refrain from watching TV right before sleep or in the middle of the night and do not keep the TV or radio on during the night. Also, try not to use or play on electronic devices at bedtime, such as your cell phone, tablet PC or laptop. If you like to read at bedtime on an electronic device, try to dim the background light as much as possible. Do  not eat in the middle of the night.   We will request a sleep study. We will look for snoring or sleep apnea.   For chronic insomnia, you are best followed by a psychiatrist and/or sleep psychologist.   We will call you with the HT-sleep study results and make a follow up appointment .

## 2018-11-13 ENCOUNTER — Ambulatory Visit: Payer: Medicare HMO | Admitting: Physician Assistant

## 2018-11-13 ENCOUNTER — Ambulatory Visit: Payer: Medicare HMO | Admitting: Family Medicine

## 2018-11-14 DIAGNOSIS — F329 Major depressive disorder, single episode, unspecified: Secondary | ICD-10-CM | POA: Diagnosis not present

## 2018-11-21 ENCOUNTER — Other Ambulatory Visit: Payer: Self-pay

## 2018-11-21 ENCOUNTER — Ambulatory Visit (INDEPENDENT_AMBULATORY_CARE_PROVIDER_SITE_OTHER): Payer: Medicare HMO | Admitting: Neurology

## 2018-11-21 DIAGNOSIS — G4719 Other hypersomnia: Secondary | ICD-10-CM

## 2018-11-21 DIAGNOSIS — G4720 Circadian rhythm sleep disorder, unspecified type: Secondary | ICD-10-CM

## 2018-11-21 DIAGNOSIS — Z72821 Inadequate sleep hygiene: Secondary | ICD-10-CM

## 2018-11-21 DIAGNOSIS — G4710 Hypersomnia, unspecified: Secondary | ICD-10-CM

## 2018-11-21 DIAGNOSIS — R0683 Snoring: Secondary | ICD-10-CM

## 2018-11-21 DIAGNOSIS — F819 Developmental disorder of scholastic skills, unspecified: Secondary | ICD-10-CM

## 2018-11-21 DIAGNOSIS — G4730 Sleep apnea, unspecified: Secondary | ICD-10-CM

## 2018-11-21 DIAGNOSIS — J4530 Mild persistent asthma, uncomplicated: Secondary | ICD-10-CM

## 2018-11-21 DIAGNOSIS — F341 Dysthymic disorder: Secondary | ICD-10-CM

## 2018-11-26 DIAGNOSIS — F329 Major depressive disorder, single episode, unspecified: Secondary | ICD-10-CM | POA: Diagnosis not present

## 2018-12-02 ENCOUNTER — Telehealth: Payer: Self-pay | Admitting: Neurology

## 2018-12-02 NOTE — Telephone Encounter (Signed)
Patient returning your [phone] .

## 2018-12-05 NOTE — Procedures (Signed)
Patient Information     First Name: Frank Fox Last Name: Kohout ID: [id number]  Birth Date: 12/26/1980 Age: 38 Gender: Male  Referring Provider: Doristine Bosworth, MD BMI: 29.0 (W=214 lb, H=6' 0'')  Neck Circ.:  17 '' Epworth:  20/24   Sleep Study Information    Study Date: Nov 27, 2018 , loaded May 11.    S/H/A Version: [phone] / 4.1.1531 / 101      History: Frank Fox is a 38 y.o. male patient, a single afro- American man living with his family, parents and younger brother. He has a history of learning disability, and seen here in a video visit upon referral  from Dr. Creta Levin for evaluation of EDS ( excessive daytime sleepiness) . His family witnesses apnea and snoring each night. His mother has OSA. He reports being very daytime sleepy, but also lacking routines of framing his day and night. Mr. Sharaf has an active diagnosis list of mild persistent asthma, depression, history of delayed development and learning disabilities, seasonal allergies with hay fever-rhinitis.  He reportedly dislocated his left shoulder which spontaneously has been back in place-baclofen to help him with pain and muscle spasms.  He is also treated with an antidepressant sertraline.  Summary & Diagnosis:     8 hours and 55 minutes of sleep time were recorded, indication of a mild sleep apnea of AHI 8.1/h being noted.  Snoring indicated at a RDI of 12.9/h. No evidence of hypoxemia.   Recommendations:     I am surprised that his family's observation are not found reflected in a higher AHI and RDI count.  Such mild apnea can be treated with CPAP, but weight loss, avoiding supine sleep position and setting a   bedtime and rise time may be valid alternative treatments. If the patient feels comfortable using a CPAP, I will provide one.  Suggested settings are heated humidity auto CPAP 5-12 cm water 2 cm EPR and mask of choice.    Electronically Signed: Melvyn Novas, MD    12-05-2018                 Sleep Summary  Oxygen Saturation Statistics   Start Study Time: End Study Time: Total Recording Time:  10:27:54PM 8:27:54 AM 10 h,  Total Sleep Time % REM of Sleep Time:  9 h, 3 min 19.9    Mean: 96 Minimum: 92 Maximum: 100  Mean of Desaturations Nadirs (%):   94  Oxygen Desaturation. %: 4-9 10-20 >20 Total  Events Number Total  24 100.0  0 0.0  0 0.0  24 100.0  Oxygen Saturation: <90 <=88 <85 <80 <70  Duration (minutes): Sleep % 0.0 0.0 0.0 0.0 0.0 0.0 0.0 0.0 0.0 0.0     Respiratory Indices      Total Events REM NREM All Night  pRDI:  115  pAHI:  72 ODI:  24  pAHIc:  11  % CSR: 0.0 12.0 6.9 1.7 1.2 13.1 8.4 2.9 1.3 12.9 8.1 2.7 1.3       Pulse Rate Statistics during Sleep (BPM)      Mean: 67 Minimum: 49 Maximum: 98    Indices are calculated using technically valid sleep time of  8 hrs, 55 min. Central-Indices are calculated using technically valid sleep time of  8  hrs, 49 min. pRDI/pAHI are calculated using oxi desaturations ? 3% Sit N/A Body Position Statistics  Position Supine Prone Right Left Non-Supine  Sleep (min) 328.0 0.0 153.5 61.5  215.0  Sleep % 60.4 0.0 28.3 11.3 39.6  pRDI 15.4 N/A 8.4 10.8 9.1  pAHI 10.2 N/A 4.0 6.9 4.8  ODI 3.3 N/A 1.6 2.0 1.7     Snoring Statistics Snoring Level (dB) >40 >50 >60 >70 >80 >Threshold (45)  Sleep (min) 61.5 8.5 2.1 0.0 0.0 16.1  Sleep % 11.3 1.6 0.4 0.0 0.0 3.0    Mean: 41 dB Sleep Stages Chart             pAHI=8.1                         Mild              Moderate                    Severe

## 2018-12-05 NOTE — Addendum Note (Signed)
Addended by: Melvyn Novas on: 12/05/2018 12:01 PM   Modules accepted: Orders

## 2018-12-09 ENCOUNTER — Telehealth: Payer: Self-pay | Admitting: Neurology

## 2018-12-09 NOTE — Telephone Encounter (Signed)
-----   Message from Melvyn Novas, MD sent at 12/05/2018 12:01 PM EDT ----- Summary & Diagnosis:   8 hours and 55 minutes of sleep time were recorded, indication  of a mild sleep apnea of AHI 8.1/h being noted.  Snoring indicated at a RDI of 12.9/h. No evidence of hypoxemia.   Recommendations:   I am surprised that his family's observations are not found  reflected in a higher AHI and RDI count.  Such mild apnea can be treated with CPAP, but weight loss,  avoiding supine sleep position and setting a  bedtime and rise  time may be valid alternative treatments. CPAP is optional-    If the patient feels  comfortable using a CPAP, I will provide one.  Suggested settings are heated humidity auto CPAP 5-12 cm water 2  cm EPR and mask of choice.

## 2018-12-09 NOTE — Telephone Encounter (Signed)
I called Frank Fox. I advised Frank Fox that Dr. Vickey Huger reviewed their sleep study results and found that Frank Fox has mild sleep apnea. Dr. Vickey Huger recommends that Frank Fox starts auto CPAP. I reviewed PAP compliance expectations with the Frank Fox. Frank Fox is agreeable to starting a CPAP. I advised Frank Fox that an order will be sent to a DME, [facility], and [facility] will call the Frank Fox within about one week after they file with the Frank Fox's insurance. [facility] will show the Frank Fox how to use the machine, fit for masks, and troubleshoot the CPAP if needed. A follow up appt was made for insurance purposes with Butch Penny, NP on July 20,2020 at 1 pm. Frank Fox verbalized understanding to arrive 15 minutes early and bring their CPAP. A letter with all of this information in it will be mailed to the Frank Fox as a reminder. I verified with the Frank Fox that the address we have on file is correct. Frank Fox verbalized understanding of results. Frank Fox had no questions at this time but was encouraged to call back if questions arise. I have sent the order to [facility] and have received confirmation that they have received the order.

## 2018-12-09 NOTE — Telephone Encounter (Signed)
Called patient to discuss sleep study results. No answer at this time. LVM for the patient to call back.   

## 2018-12-19 ENCOUNTER — Ambulatory Visit: Payer: Medicare HMO

## 2018-12-20 DIAGNOSIS — G4733 Obstructive sleep apnea (adult) (pediatric): Secondary | ICD-10-CM | POA: Diagnosis not present

## 2018-12-23 DIAGNOSIS — F329 Major depressive disorder, single episode, unspecified: Secondary | ICD-10-CM | POA: Diagnosis not present

## 2019-01-07 ENCOUNTER — Encounter: Payer: Self-pay | Admitting: Family Medicine

## 2019-01-07 DIAGNOSIS — F329 Major depressive disorder, single episode, unspecified: Secondary | ICD-10-CM | POA: Diagnosis not present

## 2019-01-07 NOTE — Progress Notes (Signed)
Nurse visit

## 2019-01-10 DIAGNOSIS — H40013 Open angle with borderline findings, low risk, bilateral: Secondary | ICD-10-CM | POA: Diagnosis not present

## 2019-01-20 DIAGNOSIS — G4733 Obstructive sleep apnea (adult) (pediatric): Secondary | ICD-10-CM | POA: Diagnosis not present

## 2019-02-10 ENCOUNTER — Telehealth: Payer: Self-pay | Admitting: Adult Health

## 2019-02-13 DIAGNOSIS — F329 Major depressive disorder, single episode, unspecified: Secondary | ICD-10-CM | POA: Diagnosis not present

## 2019-02-17 ENCOUNTER — Telehealth (INDEPENDENT_AMBULATORY_CARE_PROVIDER_SITE_OTHER): Payer: Medicare HMO | Admitting: Family Medicine

## 2019-02-17 ENCOUNTER — Other Ambulatory Visit: Payer: Self-pay

## 2019-02-17 DIAGNOSIS — M25512 Pain in left shoulder: Secondary | ICD-10-CM | POA: Diagnosis not present

## 2019-02-17 DIAGNOSIS — J302 Other seasonal allergic rhinitis: Secondary | ICD-10-CM

## 2019-02-17 MED ORDER — BACLOFEN 10 MG PO TABS: 5.0000 mg | ORAL_TABLET | Freq: Three times a day (TID) | ORAL | 3 refills | Status: AC

## 2019-02-17 MED ORDER — KETOCONAZOLE 2 % EX SHAM: 1.0000 "application " | MEDICATED_SHAMPOO | CUTANEOUS | 6 refills | Status: DC

## 2019-02-17 MED ORDER — OLOPATADINE HCL 0.2 % OP SOLN: 1.0000 [drp] | Freq: Every day | OPHTHALMIC | 6 refills | Status: AC

## 2019-02-17 MED ORDER — FLUTICASONE PROPIONATE 50 MCG/ACT NA SUSP: 2.0000 | Freq: Every day | NASAL | 6 refills | Status: DC

## 2019-02-17 MED ORDER — TRIAMCINOLONE ACETONIDE 0.5 % EX OINT: 1.0000 "application " | TOPICAL_OINTMENT | Freq: Two times a day (BID) | CUTANEOUS | 5 refills | Status: DC

## 2019-02-17 MED ORDER — CETIRIZINE HCL 10 MG PO TABS: 10.0000 mg | ORAL_TABLET | Freq: Every day | ORAL | 1 refills | Status: DC

## 2019-02-17 NOTE — Progress Notes (Signed)
CC: medication refill on flonase, olopatadine eye gtts and nizoral shampoo.  No recent weight or bp.  No travel outside the [place] or [place] in the past 3 weeks.

## 2019-02-17 NOTE — Progress Notes (Signed)
Telemedicine Encounter- SOAP NOTE Established Patient  This telephone encounter was conducted with the patient's (or proxy's) verbal consent via audio telecommunications: yes/no: Yes Patient was instructed to have this encounter in a suitably private space; and to only have persons present to whom they give permission to participate. In addition, patient identity was confirmed by use of name plus two identifiers (DOB and address).  I discussed the limitations, risks, security and privacy concerns of performing an evaluation and management service by telephone and the availability of in person appointments. I also discussed with the patient that there may be a patient responsible charge related to this service. The patient expressed understanding and agreed to proceed.  I spent a total of TIME; 0 MIN TO 60 MIN: 15 minutes talking with the patient or their proxy.  CC: allergy meds, med refill  Subjective   Frank OatsChristian Fox is a 38 y.o. established patient. Telephone visit today for  HPI  Allergies  Patient reports that mowed grass, trees causes eye irritation He denies any purulent drainage from the eyes He also takes flonase for his allergies   nizoral shampoo He denies any dandruff He states htat he is using his shampoo to wash his hair twice a week   Patient Active Problem List   Diagnosis Date Noted  . Excessive daytime sleepiness 11/12/2018  . Poor sleep hygiene 11/12/2018  . Abnormal circadian rhythm 11/12/2018  . Snoring 11/12/2018  . Sleep apnea 11/12/2018  . Mild persistent asthma without complication 05/10/2018  . Depression 05/10/2018  . Learning disability 05/10/2018  . Seasonal allergies 05/10/2018  . Hyperopia of both eyes 05/10/2018    Past Medical History:  Diagnosis Date  . Allergy   . Asthma   . Learning disability     Current Outpatient Medications  Medication Sig Dispense Refill  . albuterol (PROVENTIL HFA;VENTOLIN HFA) 108 (90 Base) MCG/ACT  inhaler Inhale 2 puffs into the lungs every 4 (four) hours as needed for wheezing or shortness of breath. 1 Inhaler 2  . baclofen (LIORESAL) 10 MG tablet Take 0.5-1 tablets (5-10 mg total) by mouth 3 (three) times daily. Take as needed for pain 30 each 3  . cetirizine (ZYRTEC) 10 MG tablet Take 1 tablet (10 mg total) by mouth daily. 90 tablet 1  . FLOVENT HFA 110 MCG/ACT inhaler Inhale 2 puffs into the lungs 2 (two) times daily. 3 Inhaler 1  . fluticasone (FLONASE) 50 MCG/ACT nasal spray Place 2 sprays into both nostrils daily. 16 g 6  . ibuprofen (ADVIL,MOTRIN) 600 MG tablet Take 600 mg by mouth every 6 (six) hours as needed.    Marland Kitchen. ketoconazole (NIZORAL) 2 % shampoo Apply 1 application topically 2 (two) times a week. 120 mL 6  . Olopatadine HCl 0.2 % SOLN Apply 1 drop to eye daily. 2.5 mL 6  . sertraline (ZOLOFT) 100 MG tablet Take 1 tablet (100 mg total) by mouth at bedtime. 90 tablet 3  . triamcinolone ointment (KENALOG) 0.5 % Apply 1 application topically 2 (two) times daily. Applied to the scalp. 30 g 5   No current facility-administered medications for this visit.     No Known Allergies  Social History   Socioeconomic History  . Marital status: Single    Spouse name: Not on file  . Number of children: 0  . Years of education: Not on file  . Highest education level: Not on file  Occupational History  . Occupation: unemployed  Social Needs  . Physicist, medicalinancial resource  strain: Not on file  . Food insecurity    Worry: Not on file    Inability: Not on file  . Transportation needs    Medical: Not on file    Non-medical: Not on file  Tobacco Use  . Smoking status: Current Some Day Smoker    Types: Cigars  . Smokeless tobacco: Never Used  . Tobacco comment: occassionally smokes 1-2 black and milds a week  Substance and Sexual Activity  . Alcohol use: Yes    Comment: occasional  . Drug use: Not Currently  . Sexual activity: Not Currently    Partners: Female  Lifestyle  . Physical  activity    Days per week: 0 days    Minutes per session: 0 min  . Stress: Not on file  Relationships  . Social Herbalist on phone: Not on file    Gets together: Not on file    Attends religious service: Not on file    Active member of club or organization: Not on file    Attends meetings of clubs or organizations: Not on file    Relationship status: Not on file  . Intimate partner violence    Fear of current or ex partner: Not on file    Emotionally abused: Not on file    Physically abused: Not on file    Forced sexual activity: Not on file  Other Topics Concern  . Not on file  Social History Narrative   Just moved from [place] to [place]. Living with brother. Currently unemployed. He has a girlfriend. No children.     ROS  Objective    Unable to perform exam due to phone visit Normal pulmon effort  Vitals as reported by the patient: There were no vitals filed for this visit.  Diagnoses and all orders for this visit:  Seasonal allergies -     ketoconazole (NIZORAL) 2 % shampoo; Apply 1 application topically 2 (two) times a week. -     fluticasone (FLONASE) 50 MCG/ACT nasal spray; Place 2 sprays into both nostrils daily. -     Olopatadine HCl 0.2 % SOLN; Apply 1 drop to eye daily. -     triamcinolone ointment (KENALOG) 0.5 %; Apply 1 application topically 2 (two) times daily. Applied to the scalp. -     cetirizine (ZYRTEC) 10 MG tablet; Take 1 tablet (10 mg total) by mouth daily.  Left shoulder pain, unspecified chronicity -     baclofen (LIORESAL) 10 MG tablet; Take 0.5-1 tablets (5-10 mg total) by mouth 3 (three) times daily. Take as needed for pain   Refilled allergy meds and baclofen Pt otherwise stable Moved to [facility] Updated address Return to clinic in October for physical exam   I discussed the assessment and treatment plan with the patient. The patient was provided an opportunity to ask questions and all were answered. The patient agreed with  the plan and demonstrated an understanding of the instructions.   The patient was advised to call back or seek an in-person evaluation if the symptoms worsen or if the condition fails to improve as anticipated.  I provided 15 minutes of non-face-to-face time during this encounter.  Forrest Moron, MD  Primary Care at [facility]

## 2019-02-17 NOTE — Patient Instructions (Signed)
° ° ° °  If you have lab work done today you will be contacted with your lab results within the next 2 weeks.  If you have not heard from us then please contact us. The fastest way to get your results is to register for My Chart. ° ° °IF you received an x-ray today, you will receive an invoice from [facility]. Please contact [facility] at [phone] with questions or concerns regarding your invoice.  ° °IF you received labwork today, you will receive an invoice from [facility]. Please contact [facility] at [phone] with questions or concerns regarding your invoice.  ° °Our billing staff will not be able to assist you with questions regarding bills from these companies. ° °You will be contacted with the lab results as soon as they are available. The fastest way to get your results is to activate your My Chart account. Instructions are located on the last page of this paperwork. If you have not heard from us regarding the results in 2 weeks, please contact this office. °  ° ° ° °

## 2019-02-19 DIAGNOSIS — G4733 Obstructive sleep apnea (adult) (pediatric): Secondary | ICD-10-CM | POA: Diagnosis not present

## 2019-02-25 ENCOUNTER — Other Ambulatory Visit: Payer: Self-pay | Admitting: Family Medicine

## 2019-02-25 MED ORDER — SERTRALINE HCL 100 MG PO TABS: 100.0000 mg | ORAL_TABLET | Freq: Every day | ORAL | 0 refills | Status: AC

## 2019-02-25 NOTE — Telephone Encounter (Signed)
Zoloft sent for 90 day supply to [facility] per pt

## 2019-02-25 NOTE — Telephone Encounter (Signed)
Copied from [place] [id number]. Topic: Quick Communication - Rx Refill/Question >> Feb 25, 2019 12:36 PM Leward Quan A wrote: Medication: sertraline (ZOLOFT) 100 MG tablet [  Has the patient contacted their pharmacy? Yes.   (Agent: If no, request that the patient contact the pharmacy for the refill.) (Agent: If yes, when and what did the pharmacy advise?)  Preferred Pharmacy (with phone number or street name): [place], [place] [phone] (Phone) [phone] (Fax)  [place], [place] - [address] [phone] (Phone) [phone] (Fax)    Agent: Please be advised that RX refills may take up to 3 business days. We ask that you follow-up with your pharmacy.

## 2019-02-25 NOTE — Telephone Encounter (Signed)
Called pt and informed him that I can send the medication for 90 day supply and scheduled him for a f/u on mood for 04/07/19 @4 :40pm

## 2019-02-27 DIAGNOSIS — F329 Major depressive disorder, single episode, unspecified: Secondary | ICD-10-CM | POA: Diagnosis not present

## 2019-03-03 DIAGNOSIS — R6889 Other general symptoms and signs: Secondary | ICD-10-CM | POA: Diagnosis not present

## 2019-03-12 DIAGNOSIS — R6889 Other general symptoms and signs: Secondary | ICD-10-CM | POA: Diagnosis not present

## 2019-03-14 DIAGNOSIS — F329 Major depressive disorder, single episode, unspecified: Secondary | ICD-10-CM | POA: Diagnosis not present

## 2019-03-18 ENCOUNTER — Other Ambulatory Visit: Payer: Self-pay | Admitting: Family Medicine

## 2019-03-18 DIAGNOSIS — F329 Major depressive disorder, single episode, unspecified: Secondary | ICD-10-CM | POA: Diagnosis not present

## 2019-03-18 DIAGNOSIS — J302 Other seasonal allergic rhinitis: Secondary | ICD-10-CM

## 2019-03-18 MED ORDER — TRIAMCINOLONE ACETONIDE 0.5 % EX OINT: 1.0000 "application " | TOPICAL_OINTMENT | Freq: Two times a day (BID) | CUTANEOUS | 5 refills | Status: AC

## 2019-03-18 MED ORDER — KETOCONAZOLE 2 % EX SHAM: 1.0000 "application " | MEDICATED_SHAMPOO | CUTANEOUS | 6 refills | Status: DC

## 2019-03-18 MED ORDER — FLUTICASONE PROPIONATE 50 MCG/ACT NA SUSP: 2.0000 | Freq: Every day | NASAL | 6 refills | Status: AC

## 2019-03-18 NOTE — Telephone Encounter (Signed)
Medication Refill - Medication: triamcinolone ointment (KENALOG) 0.5 %, fluticasone (FLONASE) 50 MCG/ACT nasal spray, ketoconazole (NIZORAL) 2 % shampoo   Preferred Pharmacy:  [place], [place] [address] [phone] (Phone) [phone] (Fax)

## 2019-03-18 NOTE — Telephone Encounter (Signed)
Patient called pharmacy and the didn't have the scripts that were sent. I will resend

## 2019-03-22 DIAGNOSIS — G4733 Obstructive sleep apnea (adult) (pediatric): Secondary | ICD-10-CM | POA: Diagnosis not present

## 2019-03-24 DIAGNOSIS — G4733 Obstructive sleep apnea (adult) (pediatric): Secondary | ICD-10-CM | POA: Diagnosis not present

## 2019-03-28 DIAGNOSIS — F329 Major depressive disorder, single episode, unspecified: Secondary | ICD-10-CM | POA: Diagnosis not present

## 2019-04-03 DIAGNOSIS — F70 Mild intellectual disabilities: Secondary | ICD-10-CM | POA: Diagnosis not present

## 2019-04-03 DIAGNOSIS — M549 Dorsalgia, unspecified: Secondary | ICD-10-CM | POA: Diagnosis not present

## 2019-04-03 DIAGNOSIS — R030 Elevated blood-pressure reading, without diagnosis of hypertension: Secondary | ICD-10-CM | POA: Diagnosis not present

## 2019-04-03 DIAGNOSIS — J45909 Unspecified asthma, uncomplicated: Secondary | ICD-10-CM | POA: Diagnosis not present

## 2019-04-03 DIAGNOSIS — Z559 Problems related to education and literacy, unspecified: Secondary | ICD-10-CM | POA: Diagnosis not present

## 2019-04-03 DIAGNOSIS — J309 Allergic rhinitis, unspecified: Secondary | ICD-10-CM | POA: Diagnosis not present

## 2019-04-03 DIAGNOSIS — F410 Panic disorder [episodic paroxysmal anxiety] without agoraphobia: Secondary | ICD-10-CM | POA: Diagnosis not present

## 2019-04-03 DIAGNOSIS — F329 Major depressive disorder, single episode, unspecified: Secondary | ICD-10-CM | POA: Diagnosis not present

## 2019-04-03 DIAGNOSIS — Z5689 Other problems related to employment: Secondary | ICD-10-CM | POA: Diagnosis not present

## 2019-04-06 ENCOUNTER — Other Ambulatory Visit: Payer: Self-pay

## 2019-04-06 NOTE — Progress Notes (Signed)
CHLAMYDIA T: NEGATIVE NEISSERIA G. : NEGATIVE 

## 2019-04-07 ENCOUNTER — Other Ambulatory Visit: Payer: Self-pay

## 2019-04-07 ENCOUNTER — Telehealth: Payer: Medicare HMO | Admitting: Family Medicine

## 2019-04-08 ENCOUNTER — Encounter: Payer: Self-pay | Admitting: Family Medicine

## 2019-04-17 ENCOUNTER — Other Ambulatory Visit: Payer: Self-pay | Admitting: Family Medicine

## 2019-04-17 DIAGNOSIS — J302 Other seasonal allergic rhinitis: Secondary | ICD-10-CM

## 2019-04-22 DIAGNOSIS — G4733 Obstructive sleep apnea (adult) (pediatric): Secondary | ICD-10-CM | POA: Diagnosis not present

## 2019-04-22 DIAGNOSIS — F329 Major depressive disorder, single episode, unspecified: Secondary | ICD-10-CM | POA: Diagnosis not present

## 2019-04-24 DIAGNOSIS — G4733 Obstructive sleep apnea (adult) (pediatric): Secondary | ICD-10-CM | POA: Diagnosis not present

## 2019-05-01 DIAGNOSIS — F329 Major depressive disorder, single episode, unspecified: Secondary | ICD-10-CM | POA: Diagnosis not present

## 2019-05-09 DIAGNOSIS — F329 Major depressive disorder, single episode, unspecified: Secondary | ICD-10-CM | POA: Diagnosis not present

## 2019-05-12 DIAGNOSIS — R6889 Other general symptoms and signs: Secondary | ICD-10-CM | POA: Diagnosis not present

## 2019-05-12 DIAGNOSIS — H40013 Open angle with borderline findings, low risk, bilateral: Secondary | ICD-10-CM | POA: Diagnosis not present

## 2019-05-14 ENCOUNTER — Encounter: Payer: Medicare HMO | Admitting: Family Medicine

## 2019-05-22 DIAGNOSIS — G4733 Obstructive sleep apnea (adult) (pediatric): Secondary | ICD-10-CM | POA: Diagnosis not present

## 2019-05-28 DIAGNOSIS — G4733 Obstructive sleep apnea (adult) (pediatric): Secondary | ICD-10-CM | POA: Diagnosis not present

## 2019-06-03 DIAGNOSIS — F329 Major depressive disorder, single episode, unspecified: Secondary | ICD-10-CM | POA: Diagnosis not present

## 2019-06-05 ENCOUNTER — Telehealth: Payer: Self-pay | Admitting: Family Medicine

## 2019-06-05 NOTE — Telephone Encounter (Signed)
Pt states that he is changing provider and would like to transfer all records to his new primary care

## 2019-06-06 NOTE — Telephone Encounter (Signed)
I have called pt, no DPR.  I left a generic message for him to call back PCP. I need a verbal auth to be able to release his medical records.

## 2019-06-09 ENCOUNTER — Encounter: Payer: Medicare HMO | Admitting: Family Medicine

## 2019-06-22 DIAGNOSIS — G4733 Obstructive sleep apnea (adult) (pediatric): Secondary | ICD-10-CM | POA: Diagnosis not present

## 2019-06-25 DIAGNOSIS — G4733 Obstructive sleep apnea (adult) (pediatric): Secondary | ICD-10-CM | POA: Diagnosis not present

## 2019-06-26 DIAGNOSIS — F17210 Nicotine dependence, cigarettes, uncomplicated: Secondary | ICD-10-CM | POA: Diagnosis not present

## 2019-06-26 DIAGNOSIS — G4733 Obstructive sleep apnea (adult) (pediatric): Secondary | ICD-10-CM | POA: Diagnosis not present

## 2019-06-26 DIAGNOSIS — J4530 Mild persistent asthma, uncomplicated: Secondary | ICD-10-CM | POA: Diagnosis not present

## 2019-06-26 DIAGNOSIS — Z9989 Dependence on other enabling machines and devices: Secondary | ICD-10-CM | POA: Diagnosis not present

## 2019-06-26 DIAGNOSIS — F3341 Major depressive disorder, recurrent, in partial remission: Secondary | ICD-10-CM | POA: Diagnosis not present

## 2019-06-26 DIAGNOSIS — M25512 Pain in left shoulder: Secondary | ICD-10-CM | POA: Diagnosis not present

## 2019-06-26 DIAGNOSIS — R6889 Other general symptoms and signs: Secondary | ICD-10-CM | POA: Diagnosis not present

## 2019-06-26 DIAGNOSIS — G8929 Other chronic pain: Secondary | ICD-10-CM | POA: Diagnosis not present

## 2019-06-26 DIAGNOSIS — E669 Obesity, unspecified: Secondary | ICD-10-CM | POA: Diagnosis not present

## 2019-07-08 DIAGNOSIS — F330 Major depressive disorder, recurrent, mild: Secondary | ICD-10-CM | POA: Diagnosis not present

## 2019-07-08 DIAGNOSIS — F419 Anxiety disorder, unspecified: Secondary | ICD-10-CM | POA: Diagnosis not present

## 2019-07-22 DIAGNOSIS — G4733 Obstructive sleep apnea (adult) (pediatric): Secondary | ICD-10-CM | POA: Diagnosis not present

## 2019-07-24 DIAGNOSIS — I10 Essential (primary) hypertension: Secondary | ICD-10-CM | POA: Diagnosis not present

## 2019-07-24 DIAGNOSIS — E669 Obesity, unspecified: Secondary | ICD-10-CM | POA: Diagnosis not present

## 2019-07-24 DIAGNOSIS — F17210 Nicotine dependence, cigarettes, uncomplicated: Secondary | ICD-10-CM | POA: Diagnosis not present

## 2019-07-24 DIAGNOSIS — G8929 Other chronic pain: Secondary | ICD-10-CM | POA: Diagnosis not present

## 2019-07-24 DIAGNOSIS — Z87892 Personal history of anaphylaxis: Secondary | ICD-10-CM | POA: Diagnosis not present

## 2019-07-24 DIAGNOSIS — R6889 Other general symptoms and signs: Secondary | ICD-10-CM | POA: Diagnosis not present

## 2019-07-24 DIAGNOSIS — J45909 Unspecified asthma, uncomplicated: Secondary | ICD-10-CM | POA: Diagnosis not present

## 2019-07-24 DIAGNOSIS — Z6832 Body mass index (BMI) 32.0-32.9, adult: Secondary | ICD-10-CM | POA: Diagnosis not present

## 2019-07-24 DIAGNOSIS — M25512 Pain in left shoulder: Secondary | ICD-10-CM | POA: Diagnosis not present

## 2019-07-24 DIAGNOSIS — G4733 Obstructive sleep apnea (adult) (pediatric): Secondary | ICD-10-CM | POA: Diagnosis not present

## 2019-08-05 DIAGNOSIS — G8929 Other chronic pain: Secondary | ICD-10-CM | POA: Diagnosis not present

## 2019-08-05 DIAGNOSIS — M898X1 Other specified disorders of bone, shoulder: Secondary | ICD-10-CM | POA: Diagnosis not present

## 2019-08-05 DIAGNOSIS — M25512 Pain in left shoulder: Secondary | ICD-10-CM | POA: Diagnosis not present

## 2019-08-12 DIAGNOSIS — M25512 Pain in left shoulder: Secondary | ICD-10-CM | POA: Diagnosis not present

## 2019-08-12 DIAGNOSIS — G8929 Other chronic pain: Secondary | ICD-10-CM | POA: Diagnosis not present

## 2019-08-13 DIAGNOSIS — G4733 Obstructive sleep apnea (adult) (pediatric): Secondary | ICD-10-CM | POA: Diagnosis not present

## 2019-08-14 DIAGNOSIS — T781XXA Other adverse food reactions, not elsewhere classified, initial encounter: Secondary | ICD-10-CM | POA: Diagnosis not present

## 2019-08-14 DIAGNOSIS — J4540 Moderate persistent asthma, uncomplicated: Secondary | ICD-10-CM | POA: Diagnosis not present

## 2019-08-14 DIAGNOSIS — R062 Wheezing: Secondary | ICD-10-CM | POA: Diagnosis not present

## 2019-08-14 DIAGNOSIS — J45909 Unspecified asthma, uncomplicated: Secondary | ICD-10-CM | POA: Diagnosis not present

## 2019-08-21 DIAGNOSIS — F329 Major depressive disorder, single episode, unspecified: Secondary | ICD-10-CM | POA: Diagnosis not present

## 2019-08-22 DIAGNOSIS — M25512 Pain in left shoulder: Secondary | ICD-10-CM | POA: Diagnosis not present

## 2019-08-22 DIAGNOSIS — G8929 Other chronic pain: Secondary | ICD-10-CM | POA: Diagnosis not present

## 2019-08-22 DIAGNOSIS — G4733 Obstructive sleep apnea (adult) (pediatric): Secondary | ICD-10-CM | POA: Diagnosis not present

## 2019-08-26 DIAGNOSIS — G8929 Other chronic pain: Secondary | ICD-10-CM | POA: Diagnosis not present

## 2019-08-26 DIAGNOSIS — M25512 Pain in left shoulder: Secondary | ICD-10-CM | POA: Diagnosis not present

## 2019-08-29 DIAGNOSIS — F325 Major depressive disorder, single episode, in full remission: Secondary | ICD-10-CM | POA: Diagnosis not present

## 2019-08-29 DIAGNOSIS — M62838 Other muscle spasm: Secondary | ICD-10-CM | POA: Diagnosis not present

## 2019-08-29 DIAGNOSIS — L309 Dermatitis, unspecified: Secondary | ICD-10-CM | POA: Diagnosis not present

## 2019-08-29 DIAGNOSIS — M25512 Pain in left shoulder: Secondary | ICD-10-CM | POA: Diagnosis not present

## 2019-08-29 DIAGNOSIS — J45909 Unspecified asthma, uncomplicated: Secondary | ICD-10-CM | POA: Diagnosis not present

## 2019-08-29 DIAGNOSIS — E669 Obesity, unspecified: Secondary | ICD-10-CM | POA: Diagnosis not present

## 2019-08-29 DIAGNOSIS — G4733 Obstructive sleep apnea (adult) (pediatric): Secondary | ICD-10-CM | POA: Diagnosis not present

## 2019-09-05 DIAGNOSIS — G8929 Other chronic pain: Secondary | ICD-10-CM | POA: Diagnosis not present

## 2019-09-05 DIAGNOSIS — M898X1 Other specified disorders of bone, shoulder: Secondary | ICD-10-CM | POA: Diagnosis not present

## 2019-09-05 DIAGNOSIS — M25512 Pain in left shoulder: Secondary | ICD-10-CM | POA: Diagnosis not present

## 2019-09-18 DIAGNOSIS — G8929 Other chronic pain: Secondary | ICD-10-CM | POA: Diagnosis not present

## 2019-09-18 DIAGNOSIS — M25512 Pain in left shoulder: Secondary | ICD-10-CM | POA: Diagnosis not present

## 2019-09-21 DIAGNOSIS — G4733 Obstructive sleep apnea (adult) (pediatric): Secondary | ICD-10-CM | POA: Diagnosis not present

## 2019-09-25 DIAGNOSIS — G4733 Obstructive sleep apnea (adult) (pediatric): Secondary | ICD-10-CM | POA: Diagnosis not present

## 2019-09-28 ENCOUNTER — Other Ambulatory Visit: Payer: Self-pay | Admitting: Family Medicine

## 2019-09-28 DIAGNOSIS — J302 Other seasonal allergic rhinitis: Secondary | ICD-10-CM

## 2019-09-28 NOTE — Telephone Encounter (Signed)
Requested Prescriptions  Pending Prescriptions Disp Refills  . cetirizine (ZYRTEC) 10 MG tablet [Pharmacy Med Name: CETIRIZINE 10MG  TABLETS] 90 tablet 1    Sig: TAKE 1 TABLET(10 MG) BY MOUTH DAILY     Ear, Nose, and Throat:  Antihistamines Passed - 09/28/2019  6:56 AM      Passed - Valid encounter within last 12 months    Recent Outpatient Visits          7 months ago Seasonal allergies   Primary Care at [facility], [facility], [place]   11 months ago Sleep disorder   Primary Care at [facility], [facility], [place]   11 months ago No-show for appointment   Primary Care at [facility], [facility], [place]   11 months ago Routine screening for STI (sexually transmitted infection)   Primary Care at [facility], [facility], [place]   1 year ago Seborrheic dermatitis of scalp   Primary Care at [facility], [facility], [place]

## 2019-10-10 DIAGNOSIS — G8929 Other chronic pain: Secondary | ICD-10-CM | POA: Diagnosis not present

## 2019-10-10 DIAGNOSIS — M25512 Pain in left shoulder: Secondary | ICD-10-CM | POA: Diagnosis not present

## 2019-10-20 DIAGNOSIS — G4733 Obstructive sleep apnea (adult) (pediatric): Secondary | ICD-10-CM | POA: Diagnosis not present

## 2019-11-11 DIAGNOSIS — I10 Essential (primary) hypertension: Secondary | ICD-10-CM | POA: Diagnosis not present

## 2019-11-11 DIAGNOSIS — F79 Unspecified intellectual disabilities: Secondary | ICD-10-CM | POA: Diagnosis not present

## 2019-11-11 DIAGNOSIS — F3341 Major depressive disorder, recurrent, in partial remission: Secondary | ICD-10-CM | POA: Diagnosis not present

## 2019-11-11 DIAGNOSIS — Z79899 Other long term (current) drug therapy: Secondary | ICD-10-CM | POA: Diagnosis not present

## 2019-11-11 DIAGNOSIS — F17210 Nicotine dependence, cigarettes, uncomplicated: Secondary | ICD-10-CM | POA: Diagnosis not present

## 2019-11-13 DIAGNOSIS — F329 Major depressive disorder, single episode, unspecified: Secondary | ICD-10-CM | POA: Diagnosis not present

## 2019-11-20 DIAGNOSIS — G4733 Obstructive sleep apnea (adult) (pediatric): Secondary | ICD-10-CM | POA: Diagnosis not present

## 2019-12-20 DIAGNOSIS — G4733 Obstructive sleep apnea (adult) (pediatric): Secondary | ICD-10-CM | POA: Diagnosis not present

## 2020-04-26 IMAGING — DX DG SHOULDER 2+V*L*
3 series · 3 of 3 positions shown · non-contrast
Comparison: None.

CLINICAL DATA: Shoulder pain for 1 month

EXAM:
LEFT SHOULDER - 2+ VIEW

[shoulder ap]
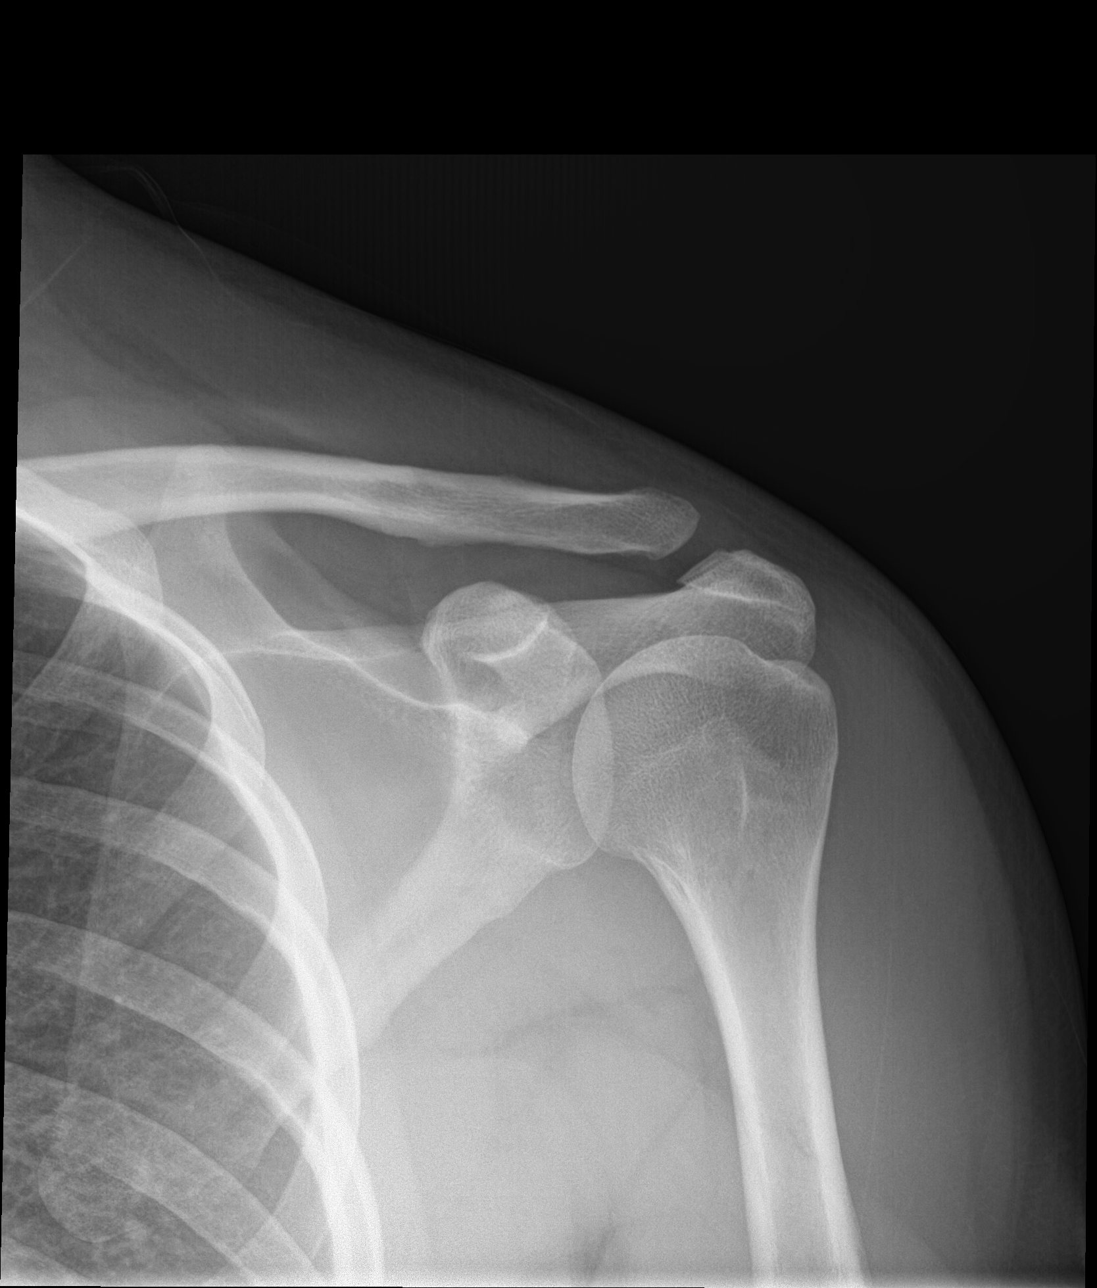

[shoulder y-view]
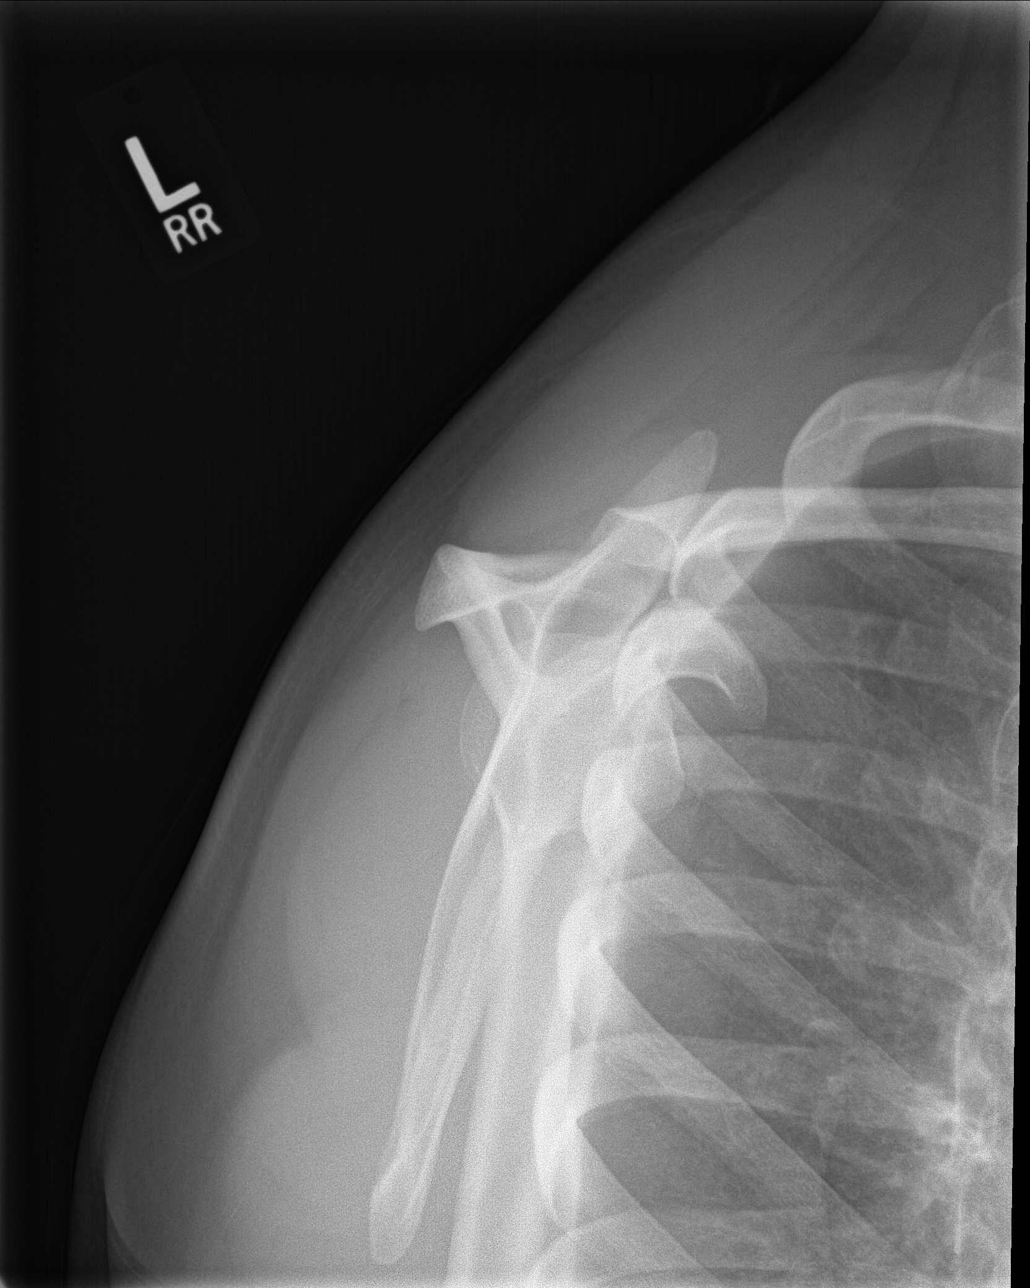

[shoulder axial]
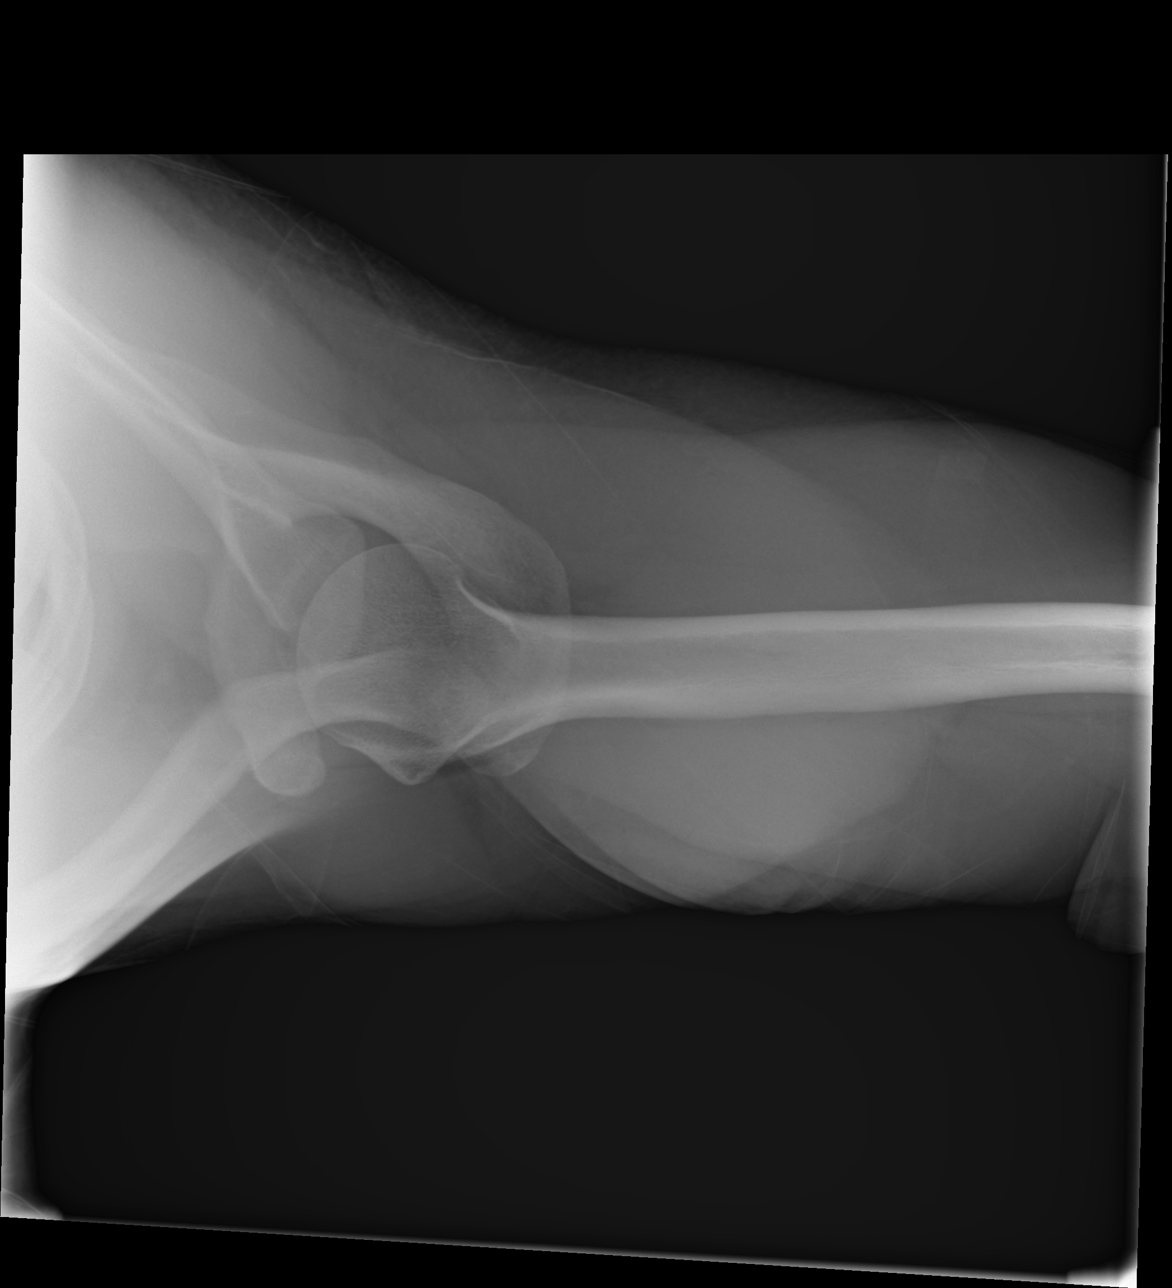

[3 of 3 positions shown; findings below may reference images not displayed]

FINDINGS: There is no evidence of fracture or dislocation. There is no
evidence of arthropathy or other focal bone abnormality. Soft
tissues are unremarkable.
IMPRESSION: No acute osseous injury of the left shoulder.
